# Patient Record
Sex: Female | Born: 1945 | Race: White | Hispanic: No | Smoking: Current every day smoker
Health system: Southern US, Community
[De-identification: ages and names within clinical notes are randomized; demographics above are authoritative.]

## PROBLEM LIST (undated history)

## (undated) DIAGNOSIS — F329 Major depressive disorder, single episode, unspecified: Secondary | ICD-10-CM

## (undated) DIAGNOSIS — F419 Anxiety disorder, unspecified: Secondary | ICD-10-CM

## (undated) DIAGNOSIS — J449 Chronic obstructive pulmonary disease, unspecified: Secondary | ICD-10-CM

## (undated) DIAGNOSIS — I10 Essential (primary) hypertension: Secondary | ICD-10-CM

## (undated) DIAGNOSIS — M1990 Unspecified osteoarthritis, unspecified site: Secondary | ICD-10-CM

## (undated) DIAGNOSIS — F32A Depression, unspecified: Secondary | ICD-10-CM

## (undated) DIAGNOSIS — E785 Hyperlipidemia, unspecified: Secondary | ICD-10-CM

## (undated) DIAGNOSIS — I639 Cerebral infarction, unspecified: Secondary | ICD-10-CM

## (undated) DIAGNOSIS — K8020 Calculus of gallbladder without cholecystitis without obstruction: Secondary | ICD-10-CM

## (undated) HISTORY — PX: CHOLECYSTECTOMY: SHX55

## (undated) HISTORY — DX: Calculus of gallbladder without cholecystitis without obstruction: K80.20

## (undated) HISTORY — PX: CATARACT EXTRACTION: SUR2

## (undated) HISTORY — DX: Hyperlipidemia, unspecified: E78.5

## (undated) HISTORY — DX: Cerebral infarction, unspecified: I63.9

## (undated) HISTORY — PX: ABDOMINAL HYSTERECTOMY: SHX81

## (undated) HISTORY — PX: TUBAL LIGATION: SHX77

## (undated) HISTORY — DX: Unspecified osteoarthritis, unspecified site: M19.90

## (undated) HISTORY — DX: Essential (primary) hypertension: I10

## (undated) HISTORY — PX: BLADDER SURGERY: SHX569

## (undated) HISTORY — PX: APPENDECTOMY: SHX54

## (undated) HISTORY — PX: MANDIBLE SURGERY: SHX707

---

## 1999-12-23 ENCOUNTER — Encounter: Admission: RE | Admit: 1999-12-23 | Discharge: 1999-12-23 | Payer: Self-pay | Admitting: Unknown Physician Specialty

## 2000-10-26 ENCOUNTER — Encounter: Admission: RE | Admit: 2000-10-26 | Discharge: 2001-01-06 | Payer: Self-pay | Admitting: Anesthesiology

## 2001-04-20 ENCOUNTER — Encounter: Admission: RE | Admit: 2001-04-20 | Discharge: 2001-07-19 | Payer: Self-pay

## 2001-07-23 ENCOUNTER — Encounter: Admission: RE | Admit: 2001-07-23 | Discharge: 2001-10-21 | Payer: Self-pay

## 2001-10-19 ENCOUNTER — Ambulatory Visit (HOSPITAL_COMMUNITY): Admission: RE | Admit: 2001-10-19 | Discharge: 2001-10-19 | Payer: Self-pay

## 2001-11-12 ENCOUNTER — Encounter: Admission: RE | Admit: 2001-11-12 | Discharge: 2002-02-05 | Payer: Self-pay | Admitting: Anesthesiology

## 2001-12-17 ENCOUNTER — Ambulatory Visit (HOSPITAL_COMMUNITY): Admission: RE | Admit: 2001-12-17 | Discharge: 2001-12-17 | Payer: Self-pay | Admitting: Anesthesiology

## 2001-12-17 ENCOUNTER — Encounter: Payer: Self-pay | Admitting: Anesthesiology

## 2002-02-18 ENCOUNTER — Encounter: Admission: RE | Admit: 2002-02-18 | Discharge: 2002-05-19 | Payer: Self-pay | Admitting: Anesthesiology

## 2002-05-17 ENCOUNTER — Encounter: Admission: RE | Admit: 2002-05-17 | Discharge: 2002-08-15 | Payer: Self-pay

## 2002-08-19 ENCOUNTER — Encounter: Admission: RE | Admit: 2002-08-19 | Discharge: 2002-11-17 | Payer: Self-pay

## 2002-12-25 ENCOUNTER — Encounter
Admission: RE | Admit: 2002-12-25 | Discharge: 2003-03-25 | Payer: Self-pay | Admitting: Physical Medicine & Rehabilitation

## 2003-03-28 ENCOUNTER — Encounter
Admission: RE | Admit: 2003-03-28 | Discharge: 2003-06-26 | Payer: Self-pay | Admitting: Physical Medicine & Rehabilitation

## 2003-08-04 ENCOUNTER — Encounter
Admission: RE | Admit: 2003-08-04 | Discharge: 2003-11-02 | Payer: Self-pay | Admitting: Physical Medicine & Rehabilitation

## 2003-12-16 ENCOUNTER — Encounter
Admission: RE | Admit: 2003-12-16 | Discharge: 2004-03-15 | Payer: Self-pay | Admitting: Physical Medicine & Rehabilitation

## 2004-03-25 ENCOUNTER — Encounter
Admission: RE | Admit: 2004-03-25 | Discharge: 2004-06-23 | Payer: Self-pay | Admitting: Physical Medicine & Rehabilitation

## 2004-03-26 ENCOUNTER — Ambulatory Visit: Payer: Self-pay | Admitting: Physical Medicine & Rehabilitation

## 2004-06-24 ENCOUNTER — Encounter
Admission: RE | Admit: 2004-06-24 | Discharge: 2004-09-22 | Payer: Self-pay | Admitting: Physical Medicine & Rehabilitation

## 2004-06-25 ENCOUNTER — Ambulatory Visit: Payer: Self-pay | Admitting: Physical Medicine & Rehabilitation

## 2004-08-26 ENCOUNTER — Ambulatory Visit: Payer: Self-pay | Admitting: Physical Medicine & Rehabilitation

## 2004-10-28 ENCOUNTER — Ambulatory Visit: Payer: Self-pay | Admitting: Physical Medicine & Rehabilitation

## 2004-10-28 ENCOUNTER — Encounter
Admission: RE | Admit: 2004-10-28 | Discharge: 2005-01-26 | Payer: Self-pay | Admitting: Physical Medicine & Rehabilitation

## 2005-01-26 ENCOUNTER — Encounter
Admission: RE | Admit: 2005-01-26 | Discharge: 2005-04-26 | Payer: Self-pay | Admitting: Physical Medicine & Rehabilitation

## 2005-02-24 ENCOUNTER — Ambulatory Visit: Payer: Self-pay | Admitting: Physical Medicine & Rehabilitation

## 2005-06-23 ENCOUNTER — Encounter
Admission: RE | Admit: 2005-06-23 | Discharge: 2005-09-21 | Payer: Self-pay | Admitting: Physical Medicine & Rehabilitation

## 2005-06-23 ENCOUNTER — Ambulatory Visit: Payer: Self-pay | Admitting: Physical Medicine & Rehabilitation

## 2005-09-20 ENCOUNTER — Encounter
Admission: RE | Admit: 2005-09-20 | Discharge: 2005-12-19 | Payer: Self-pay | Admitting: Physical Medicine & Rehabilitation

## 2005-09-22 ENCOUNTER — Ambulatory Visit: Payer: Self-pay | Admitting: Physical Medicine & Rehabilitation

## 2005-12-15 ENCOUNTER — Ambulatory Visit: Payer: Self-pay | Admitting: Physical Medicine & Rehabilitation

## 2006-03-10 ENCOUNTER — Encounter
Admission: RE | Admit: 2006-03-10 | Discharge: 2006-06-08 | Payer: Self-pay | Admitting: Physical Medicine & Rehabilitation

## 2006-03-10 ENCOUNTER — Ambulatory Visit: Payer: Self-pay | Admitting: Physical Medicine & Rehabilitation

## 2006-06-01 ENCOUNTER — Ambulatory Visit: Payer: Self-pay | Admitting: Physical Medicine & Rehabilitation

## 2006-06-01 ENCOUNTER — Encounter
Admission: RE | Admit: 2006-06-01 | Discharge: 2006-08-30 | Payer: Self-pay | Admitting: Physical Medicine & Rehabilitation

## 2006-08-25 ENCOUNTER — Ambulatory Visit: Payer: Self-pay | Admitting: Physical Medicine & Rehabilitation

## 2006-11-06 ENCOUNTER — Ambulatory Visit: Payer: Self-pay | Admitting: Physical Medicine & Rehabilitation

## 2006-11-06 ENCOUNTER — Encounter
Admission: RE | Admit: 2006-11-06 | Discharge: 2007-02-04 | Payer: Self-pay | Admitting: Physical Medicine & Rehabilitation

## 2006-12-19 ENCOUNTER — Ambulatory Visit: Payer: Self-pay | Admitting: Physical Medicine & Rehabilitation

## 2007-03-20 ENCOUNTER — Encounter
Admission: RE | Admit: 2007-03-20 | Discharge: 2007-04-24 | Payer: Self-pay | Admitting: Physical Medicine & Rehabilitation

## 2007-04-20 ENCOUNTER — Ambulatory Visit: Payer: Self-pay | Admitting: Physical Medicine & Rehabilitation

## 2007-07-17 ENCOUNTER — Encounter
Admission: RE | Admit: 2007-07-17 | Discharge: 2007-07-18 | Payer: Self-pay | Admitting: Physical Medicine & Rehabilitation

## 2007-07-17 ENCOUNTER — Ambulatory Visit: Payer: Self-pay | Admitting: Physical Medicine & Rehabilitation

## 2007-10-08 ENCOUNTER — Encounter
Admission: RE | Admit: 2007-10-08 | Discharge: 2007-10-08 | Payer: Self-pay | Admitting: Physical Medicine & Rehabilitation

## 2007-10-08 ENCOUNTER — Ambulatory Visit: Payer: Self-pay | Admitting: Physical Medicine & Rehabilitation

## 2008-01-04 ENCOUNTER — Encounter
Admission: RE | Admit: 2008-01-04 | Discharge: 2008-01-07 | Payer: Self-pay | Admitting: Physical Medicine & Rehabilitation

## 2008-01-07 ENCOUNTER — Ambulatory Visit: Payer: Self-pay | Admitting: Physical Medicine & Rehabilitation

## 2008-03-24 ENCOUNTER — Ambulatory Visit: Payer: Self-pay | Admitting: Physical Medicine & Rehabilitation

## 2008-03-24 ENCOUNTER — Encounter
Admission: RE | Admit: 2008-03-24 | Discharge: 2008-06-22 | Payer: Self-pay | Admitting: Physical Medicine & Rehabilitation

## 2008-05-15 ENCOUNTER — Ambulatory Visit: Payer: Self-pay | Admitting: Physical Medicine & Rehabilitation

## 2008-06-11 ENCOUNTER — Ambulatory Visit: Payer: Self-pay | Admitting: Physical Medicine & Rehabilitation

## 2008-07-24 ENCOUNTER — Encounter
Admission: RE | Admit: 2008-07-24 | Discharge: 2008-10-22 | Payer: Self-pay | Admitting: Physical Medicine and Rehabilitation

## 2008-07-28 ENCOUNTER — Ambulatory Visit: Payer: Self-pay | Admitting: Physical Medicine and Rehabilitation

## 2008-07-29 ENCOUNTER — Ambulatory Visit (HOSPITAL_COMMUNITY)
Admission: RE | Admit: 2008-07-29 | Discharge: 2008-07-29 | Payer: Self-pay | Admitting: Physical Medicine and Rehabilitation

## 2008-08-14 ENCOUNTER — Ambulatory Visit: Payer: Self-pay | Admitting: Physical Medicine and Rehabilitation

## 2008-08-27 ENCOUNTER — Ambulatory Visit: Payer: Self-pay | Admitting: Physical Medicine and Rehabilitation

## 2008-09-26 ENCOUNTER — Ambulatory Visit: Payer: Self-pay | Admitting: Physical Medicine and Rehabilitation

## 2008-10-28 ENCOUNTER — Encounter
Admission: RE | Admit: 2008-10-28 | Discharge: 2008-10-31 | Payer: Self-pay | Admitting: Physical Medicine and Rehabilitation

## 2008-10-31 ENCOUNTER — Ambulatory Visit: Payer: Self-pay | Admitting: Physical Medicine and Rehabilitation

## 2009-02-24 ENCOUNTER — Encounter
Admission: RE | Admit: 2009-02-24 | Discharge: 2009-04-30 | Payer: Self-pay | Admitting: Physical Medicine and Rehabilitation

## 2009-02-25 ENCOUNTER — Ambulatory Visit: Payer: Self-pay | Admitting: Physical Medicine and Rehabilitation

## 2009-05-18 ENCOUNTER — Encounter
Admission: RE | Admit: 2009-05-18 | Discharge: 2009-08-16 | Payer: Self-pay | Admitting: Physical Medicine and Rehabilitation

## 2009-05-29 ENCOUNTER — Ambulatory Visit: Payer: Self-pay | Admitting: Physical Medicine and Rehabilitation

## 2009-06-26 ENCOUNTER — Ambulatory Visit: Payer: Self-pay | Admitting: Physical Medicine and Rehabilitation

## 2009-08-03 ENCOUNTER — Ambulatory Visit: Payer: Self-pay | Admitting: Physical Medicine and Rehabilitation

## 2009-11-03 ENCOUNTER — Encounter
Admission: RE | Admit: 2009-11-03 | Discharge: 2010-02-01 | Payer: Self-pay | Admitting: Physical Medicine and Rehabilitation

## 2009-11-11 ENCOUNTER — Ambulatory Visit: Payer: Self-pay | Admitting: Physical Medicine and Rehabilitation

## 2009-12-14 ENCOUNTER — Ambulatory Visit: Payer: Self-pay | Admitting: Physical Medicine and Rehabilitation

## 2010-02-01 ENCOUNTER — Encounter
Admission: RE | Admit: 2010-02-01 | Discharge: 2010-05-02 | Payer: Self-pay | Source: Home / Self Care | Attending: Physical Medicine and Rehabilitation | Admitting: Physical Medicine and Rehabilitation

## 2010-02-08 ENCOUNTER — Ambulatory Visit: Payer: Self-pay | Admitting: Physical Medicine and Rehabilitation

## 2010-03-10 ENCOUNTER — Ambulatory Visit: Payer: Self-pay | Admitting: Physical Medicine and Rehabilitation

## 2010-04-07 ENCOUNTER — Ambulatory Visit: Payer: Self-pay | Admitting: Physical Medicine and Rehabilitation

## 2010-05-11 ENCOUNTER — Encounter
Admission: RE | Admit: 2010-05-11 | Discharge: 2010-05-14 | Payer: Self-pay | Source: Home / Self Care | Attending: Physical Medicine and Rehabilitation | Admitting: Physical Medicine and Rehabilitation

## 2010-05-14 ENCOUNTER — Ambulatory Visit
Admission: RE | Admit: 2010-05-14 | Discharge: 2010-05-14 | Payer: Self-pay | Source: Home / Self Care | Attending: Physical Medicine and Rehabilitation | Admitting: Physical Medicine and Rehabilitation

## 2010-06-23 ENCOUNTER — Encounter: Payer: MEDICARE | Attending: Physical Medicine and Rehabilitation

## 2010-06-23 ENCOUNTER — Ambulatory Visit (HOSPITAL_BASED_OUTPATIENT_CLINIC_OR_DEPARTMENT_OTHER): Payer: MEDICARE | Admitting: Physical Medicine and Rehabilitation

## 2010-06-23 DIAGNOSIS — IMO0001 Reserved for inherently not codable concepts without codable children: Secondary | ICD-10-CM

## 2010-06-23 DIAGNOSIS — R279 Unspecified lack of coordination: Secondary | ICD-10-CM | POA: Insufficient documentation

## 2010-06-23 DIAGNOSIS — G894 Chronic pain syndrome: Secondary | ICD-10-CM

## 2010-06-23 DIAGNOSIS — M47814 Spondylosis without myelopathy or radiculopathy, thoracic region: Secondary | ICD-10-CM | POA: Insufficient documentation

## 2010-06-23 DIAGNOSIS — R209 Unspecified disturbances of skin sensation: Secondary | ICD-10-CM | POA: Insufficient documentation

## 2010-06-23 DIAGNOSIS — M549 Dorsalgia, unspecified: Secondary | ICD-10-CM

## 2010-06-23 DIAGNOSIS — M25519 Pain in unspecified shoulder: Secondary | ICD-10-CM | POA: Insufficient documentation

## 2010-06-23 DIAGNOSIS — M47812 Spondylosis without myelopathy or radiculopathy, cervical region: Secondary | ICD-10-CM | POA: Insufficient documentation

## 2010-06-23 DIAGNOSIS — M542 Cervicalgia: Secondary | ICD-10-CM

## 2010-06-23 DIAGNOSIS — M4120 Other idiopathic scoliosis, site unspecified: Secondary | ICD-10-CM | POA: Insufficient documentation

## 2010-06-23 DIAGNOSIS — G609 Hereditary and idiopathic neuropathy, unspecified: Secondary | ICD-10-CM | POA: Insufficient documentation

## 2010-07-20 ENCOUNTER — Encounter: Payer: MEDICARE | Attending: Physical Medicine and Rehabilitation

## 2010-07-20 ENCOUNTER — Ambulatory Visit (HOSPITAL_BASED_OUTPATIENT_CLINIC_OR_DEPARTMENT_OTHER): Payer: MEDICARE

## 2010-07-20 DIAGNOSIS — M47814 Spondylosis without myelopathy or radiculopathy, thoracic region: Secondary | ICD-10-CM

## 2010-07-20 DIAGNOSIS — Z79899 Other long term (current) drug therapy: Secondary | ICD-10-CM | POA: Insufficient documentation

## 2010-07-20 DIAGNOSIS — M4120 Other idiopathic scoliosis, site unspecified: Secondary | ICD-10-CM | POA: Insufficient documentation

## 2010-07-20 DIAGNOSIS — M25519 Pain in unspecified shoulder: Secondary | ICD-10-CM | POA: Insufficient documentation

## 2010-07-20 DIAGNOSIS — E7800 Pure hypercholesterolemia, unspecified: Secondary | ICD-10-CM | POA: Insufficient documentation

## 2010-07-20 DIAGNOSIS — F17200 Nicotine dependence, unspecified, uncomplicated: Secondary | ICD-10-CM | POA: Insufficient documentation

## 2010-07-20 DIAGNOSIS — I10 Essential (primary) hypertension: Secondary | ICD-10-CM | POA: Insufficient documentation

## 2010-07-20 DIAGNOSIS — R209 Unspecified disturbances of skin sensation: Secondary | ICD-10-CM | POA: Insufficient documentation

## 2010-07-20 DIAGNOSIS — E119 Type 2 diabetes mellitus without complications: Secondary | ICD-10-CM | POA: Insufficient documentation

## 2010-07-20 DIAGNOSIS — G8929 Other chronic pain: Secondary | ICD-10-CM | POA: Insufficient documentation

## 2010-07-20 DIAGNOSIS — M47812 Spondylosis without myelopathy or radiculopathy, cervical region: Secondary | ICD-10-CM | POA: Insufficient documentation

## 2010-07-20 DIAGNOSIS — R279 Unspecified lack of coordination: Secondary | ICD-10-CM

## 2010-07-20 DIAGNOSIS — G609 Hereditary and idiopathic neuropathy, unspecified: Secondary | ICD-10-CM | POA: Insufficient documentation

## 2010-07-20 DIAGNOSIS — M5137 Other intervertebral disc degeneration, lumbosacral region: Secondary | ICD-10-CM

## 2010-08-25 ENCOUNTER — Encounter
Payer: Medicare Other | Attending: Physical Medicine and Rehabilitation | Admitting: Physical Medicine and Rehabilitation

## 2010-08-25 DIAGNOSIS — M47812 Spondylosis without myelopathy or radiculopathy, cervical region: Secondary | ICD-10-CM | POA: Insufficient documentation

## 2010-08-25 DIAGNOSIS — G894 Chronic pain syndrome: Secondary | ICD-10-CM

## 2010-08-25 DIAGNOSIS — E7800 Pure hypercholesterolemia, unspecified: Secondary | ICD-10-CM | POA: Insufficient documentation

## 2010-08-25 DIAGNOSIS — E119 Type 2 diabetes mellitus without complications: Secondary | ICD-10-CM | POA: Insufficient documentation

## 2010-08-25 DIAGNOSIS — R209 Unspecified disturbances of skin sensation: Secondary | ICD-10-CM | POA: Insufficient documentation

## 2010-08-25 DIAGNOSIS — R279 Unspecified lack of coordination: Secondary | ICD-10-CM | POA: Insufficient documentation

## 2010-08-25 DIAGNOSIS — M4120 Other idiopathic scoliosis, site unspecified: Secondary | ICD-10-CM | POA: Insufficient documentation

## 2010-08-25 DIAGNOSIS — M47814 Spondylosis without myelopathy or radiculopathy, thoracic region: Secondary | ICD-10-CM | POA: Insufficient documentation

## 2010-08-25 DIAGNOSIS — M25519 Pain in unspecified shoulder: Secondary | ICD-10-CM | POA: Insufficient documentation

## 2010-08-25 DIAGNOSIS — I10 Essential (primary) hypertension: Secondary | ICD-10-CM | POA: Insufficient documentation

## 2010-08-25 DIAGNOSIS — Z79899 Other long term (current) drug therapy: Secondary | ICD-10-CM | POA: Insufficient documentation

## 2010-08-25 DIAGNOSIS — M542 Cervicalgia: Secondary | ICD-10-CM

## 2010-08-25 DIAGNOSIS — Z794 Long term (current) use of insulin: Secondary | ICD-10-CM | POA: Insufficient documentation

## 2010-08-26 NOTE — Assessment & Plan Note (Signed)
[name] is a pleasant 65 year old woman who is followed in our [facility] for chronic pain complaints, predominantly in her right scapular region.  She also has a medical history which is significant for diabetes and peripheral neuropathy as well as small vessel ischemic changes per MRI in 2008, and a mild balance disorder.  She is back in today.  She was last seen on June 23, 2010.  At that visit, she underwent trigger point injections and she tolerated them well.  She reports very little improvement in her pain after the injections.  She states that they cost more than she would have liked as well.  She is interested in considering increasing her gabapentin to see if she can get a bit more relief from the periscapular pain.  Her average pain is about an 8 on a scale of 10.  Sleep is fair to poor. Pain is worse with most activities, improves with medication somewhat.  FUNCTIONAL STATUS:  She can walk 10 minutes at a time.  She is able to drive.  She is independent with self-care.  She is able to do some small tasks at home as well.  She does not do heavier household tasks.  REVIEW OF SYSTEMS:  Negative for any problems controlling bowel or bladder, depression, anxiety, or suicidal ideation.  She denies any new weakness, numbness, tingling, or tremors.  She does report a little gait instability, but this is not new.  [facility]ications [facility] include: 1. Gabapentin 400 mg 5 times a day. 2. P.r.n. Soma not more than once per day. 3. Lidoderm patch p.r.n. 4. Hydrocodone 5/325 b.i.d.  No other changes in past medical, social, or family history.  PHYSICAL EXAMINATION:  VITAL SIGNS:  Today, her blood pressure is 158/73, pulse 75, respirations 18, 98% saturated on room air. GENERAL:  She is a well-developed, well-nourished woman who does not appear in any distress.  She appears her stated age. NEUROLOGIC:  She is  oriented x3.  Speech is clear.  Affect is bright. She is alert, cooperative, and pleasant, follows commands without difficulty, answers my questions appropriately.  Cranial nerves, coordination are grossly intact.  Her reflexes are slightly brisk which is not new.  Her strength is good without focal deficits.  No new sensory deficits are noted.  She has some tenderness along the paraspinal musculature adjacent to her scapula, especially on the right.  She has full shoulder range of motion.  Neck range of motion is minimally limited as well.  Transitioning from standing to sitting is done without difficulty.  Gait is nonantalgic.  Tandem gait and Romberg test are also performed adequately.  IMPRESSION: 1. History of cervical spondylosis. 2. Mild thoracic scoliosis/spondylosis. 3. Myofascial component of parascapular pain. 4. History of bilateral foot numbness which is consistent with     diabetic and peripheral neuropathy. 5. Mild balance disorder which is not progressive.  Her medical problems include diabetes on insulin, hypercholesterolemia, hypertension, nicotine addiction, small vessel ischemic changes per a brain MRI back in 2008.  PLAN:  Katherine Bullock is requesting a slight increase in her gabapentin.  I have reviewed the risks and benefits of using gabapentin and the side effect profile.  She would like to try to increase this to see if she can get a little more relief.  We will switch her to 600 mg gabapentin at 8:30 a.m., 12:30 p.m., 4:30 p.m., 8:30 p.m., and 12:30 p.m. with 2 refills.  I  will also refill her Soma 350 mg 1 p.o. nightly, #30 p.r.n. muscle spasm.  She is requesting to be discontinued off the hydrocodone at this time.  She states that she would like to not have to return to the clinic each month for pill counts and thinks she can get by without it.  She maintains contact with her primary care doctor, Dr. Abner Greenspan. She has an appointment with her in the  upcoming couple of months.  Katherine Bullock has been doing well on the medications without any adverse side effects.  She appears to be satisfied with current management.  I will see her back in 3 months.  She will let me know if she has any trouble with a slight increase in her gabapentin.     Brantley Stage, M.D. Electronically Signed    DMK/MedQ D:  08/26/2010 08:14:16  T:  08/26/2010 10:09:41  Job #:  [id number]

## 2010-09-21 NOTE — Assessment & Plan Note (Signed)
Katherine Bullock returns to clinic today for followup evaluation. When we saw  her in July of 2008 we decided to discontinue the methadone and try  oxycodone 5 mg 1 to 2 tablets p.o. t.i.d. p.r.n. She subsequently called  back a couple days later saying that she was getting no relief from the  oxycodone immediate release. She requested to be restarted on the  methadone. We did give her a script for the methadone when she returned  the used oxycodone. She actually had some left over methadone from prior  scripts and did not fill that script dated November 29, 2006. She returns  the script today in the office today so that we can destroy it and give  her an up dated prescription for the methadone. She reports that on the  methadone used 3 times a day she is getting reasonable relief.   The patient has had some vague problems with not feeling well. She felt  that it may be secondary to some of her medicines and she stopped all of  her blood pressure and diabetes medicines, along with her with her  cholesterol and depression medications. She is considering restarting  each one of them gradually to see if she can tolerate them. She has been  on verapamil, along with Lasix, clonidine, Paxil, lisinopril, along with  diabetes medicines. All of those have been stopped although she  continues to take the aspirin. She was also on Plavix daily.   MEDICATIONS:  1. Methadone 10 mg 1 tablet t.i.d.  2. Aspirin 1 tablet daily.   REVIEW OF SYSTEMS:  Positive for night sweats, high blood sugar, and  limb swelling.   PHYSICAL EXAMINATION:  Reasonably well-appearing elderly adult female in  mild acute discomfort. Blood pressure 160/72, with a pulse of 68,  respiratory rate 18, and O2 saturation 98% on room air. She has minimal  swelling throughout the bilateral lower extremities. She has 4 + over 5  strength throughout.   IMPRESSION:  1. 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  2. Myofascial pain syndrome.   In the office today we did refill the patient's methadone dated as  December 20, 2006. I told her that she can probably restart her lisinopril  and possibly her verapamil without significant side effects. The  clonidine may be centrally acting and causing some of her vague feelings  of feeling ill. In any event she needs to gradually restart the blood  pressure medicines at least for now and consider starting the diabetes  medicines depending on her blood sugars at home. She reports that her  recent blood sugar was 130.   We will plan on seeing the patient in follow up in approximately 3  months time. She does have plans to follow up with her primary care  physician over the next week or so.           ______________________________  Ellwood Dense, M.D.     DC/MedQ  D:  12/20/2006 09:17:43  T:  12/21/2006 16:10:96  Job #:  [id number]

## 2010-09-21 NOTE — Assessment & Plan Note (Signed)
Ms. Ey returns to the clinic today for followup evaluation.  Her last  prescription for methadone was written on December 05, 2007.  She did  stretch out her supply to make it into today's appointment, but  generally takes 4 per day when she has sufficient supply.  She prefers  to do that way and avoid 2 trips, one for the medication and then one  for the followup.  The patient reports that her hemoglobin A1c recently  was measured at greater than 7.0.  She feels that her primary care  physician will probably start her on insulin in the near future.  Her  other medicines have been unchanged.   REVIEW OF SYSTEMS:  Positive for high blood sugar and night sweats along  with limb swelling.   MEDICATIONS:  1. Methadone 10 mg 1 tablet q.i.d.  2. Aspirin 81 mg daily.  3. Lovaza 1 g q.i.d.  4. Furosemide 20 mg 1 to 2 tablets p.o. daily p.r.n.  5. Metformin 1000 mg b.i.d.  6. Verapamil 240 mg daily.  7. Lisinopril 40 mg 2 tablets daily.  8. Clonidine 0.1 mg b.i.d.  9. Prozac 40 mg daily.  10.Pantoprazole 40 mg b.i.d.  11.Glimepiride 4 mg daily.  12.TriCor 145 mg daily.   PHYSICAL EXAMINATION:  GENERAL:  Well-appearing fit adult female in mild-  to-no acute discomfort.  VITAL SIGNS:  Blood pressure 149/62 with pulse of 80, respiratory rate  18, and O2 saturation 98% on room air.  EXTREMITIES:  She has 4+/5 strength throughout.  She ambulates with a  single-point cane.   IMPRESSION:  1. Chronic thoracic pain and thoracic scoliosis with myelopathy.  2. Mild degenerative disk disease of the thoracic spine.  3. Myofascial pain syndrome.   In the office today, we did refill the patient's methadone at 10 mg 1  tablet q.i.d. p.r.n. a total of 120 to cover up for the month.  We will  plan on seeing the patient in followup in this office in approximately 3-  4 months' times with refills prior to that appointment as necessary.  The patient continues to get good analgesic effect without signs  of  diversion or significant side effects.           ______________________________  Ellwood Dense, M.D.     DC/MedQ  D:  01/07/2008 12:23:13  T:  01/08/2008 02:46:31  Job #:  [id number]

## 2010-09-21 NOTE — Assessment & Plan Note (Signed)
Ms. Lacerda returns to clinic today for followup evaluation.  During the  last clinic visit April 23, 2007 we had adjusted her methadone to 4  times a day instead of 3 times a day at her 10 mg dose.  She reports  that she is getting much better relief.  She previously had passing out  spells that she felt were related to Lipitor.  Since she is off that  medication now on Tricor, she is having no spells and is tolerating the  methadone well.  She does need a refill over the next few weeks.  She  continues to be followed by Dr. Yetta Flock, her primary care physician, with  recent CBGs having been recorded as within normal limits.  She has a  followup visit with Dr. Yetta Flock next week.  The patient lives a fair  distance from the office, approximately 72 miles roundtrip.   REVIEW OF SYSTEMS:  Positive for high blood sugar, night sweats, limb  swelling.   MEDICATIONS:  1. Methadone 10 mg 1 tablet q.i.d.  2. Aspirin 81 mg daily.  3. Lovaza 1 gram q.i.d.  4. Furosemide 20 mg 1-2 tablets p.o. daily p.r.n.  5. Metformin 1000 mg b.i.d.  6. Verapamil 240 mg daily.  7. Lisinopril 40 mg 2 tablets daily.  8. Clonidine 0.1 mg b.i.d.  9. Prozac 40 mg daily.  10.Pantoprazole 40 mg b.i.d.  11.Glimepiride 4 mg daily.  12.Tricor 145 mg daily.   PHYSICAL EXAMINATION:  Well appearing, thin adult female in mild to no  acute discomfort.  Blood pressure 118/60 with a pulse of 94, respiratory  rate 18 and O2 saturation 98% on room air.  She has 4+/5 strength  throughout the bilateral upper and lower extremities.   IMPRESSION:  1. 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  2. Myofascial pain syndrome.   In the office today we did refill the patient's methadone at a 10 mg  strength 4 times a day as of August 11, 2007.  She will hold on to the  prescription and submit it to her pharmacy in early April.  We will plan  on seeing  the patient in followup in approximately three months' time  with refills prior to that appointment as necessary.           ______________________________  Ellwood Dense, M.D.     DC/MedQ  D:  07/18/2007 09:51:57  T:  07/18/2007 13:17:13  Job #:  [id number]

## 2010-09-21 NOTE — Assessment & Plan Note (Signed)
[name] is a pleasant 65 year old married woman, who has been a  prior patient of Dr. Ellwood Bullock.  She is back in today for a refill  of her Neurontin.  She was recently started on this in April.  She has  been on methadone and this was weaned down and she has been tolerating  the Neurontin quite well.  She was on 600 mg tablet 4 times a month and  reports significant pain relief with medication.  In fact, she notes  that if she misses doses that, her pain does worsen.  Average pain is  about 8 on the scale of 10.  Reports fairly good relief with current  meds.  Pain is typically localized to the mid back region.   Pain is typically worse with walking and prolonged sitting or standing,  improves with medication.   FUNCTIONAL STATUS:  She can walk about 10-15 minutes at a time.  She has  difficulty with stairs.  She does drive.  She is independent with self-  care.   Review of systems is notable for trouble walking, depression, and denies  anxiety or suicidal ideation.  She reports, she recently is going to be  started on some insulin.  Apparently, her diabetes is requiring that,  now her primary care Dr. Abner Bullock is managing this for her.   No other changes in past medical, social, or family history.  She  continues to smoke a pack of cigarettes a day.  She is cautioned against  this.   Exam, blood pressure 116/54, pulse 85, respirations 18, 98% saturated on  room air.  She is a thin adult female, who does not appear in any  distress and does appear her stated age.   She is oriented x3.  Speech is clear.  Affect is bright.  She is alert,  cooperative, and pleasant.  Follows commands without difficulty.  Answers my questions appropriately.   Cranial nerves and coordination are grossly intact.  Sensation is  intact.  She has limitations in range of motion in neck in all planes.  She has full shoulder range of motion.  Motor strength is 5/5 without  focal deficits.  No  sensory deficits are appreciated.  Reflexes are  generally 2+.  No abnormal tone or clonus is appreciated in the upper  and lower extremities.   IMPRESSION:  1. Chronic right scapular pain.  2. Mild thoracic scoliosis.  3. History of small vessel ischemia per CT in March 2008.   PLAN:  We will refill her gabapentin 600 mg 1 p.o. q.i.d. #120.  She has  been tolerating the medication quite well.  Denies any problems with  lower extremity swelling.  Denies problems with dizziness or balance and  finds that the medication is significantly helping her pain.  We will go  ahead and refill this with 3 refills.  We will see her back in 4 months.           ______________________________  Katherine Bullock, M.D.     DMK/MedQ  D:  10/31/2008 11:48:05  T:  11/01/2008 01:08:10  Job #:  [id number]

## 2010-09-21 NOTE — Assessment & Plan Note (Signed)
Katherine Bullock is a pleasant 65 year old married woman who had been a prior  patient of Dr. Ellwood Dense.  She was initially seen by me on July 28, 2008.  Her last visit was August 14, 2008.   Her pain has been treated in the past by 10 mg of methadone q.i.d., this  was wean down, her last dose of methadone was on August 11, 2008.  In the  interim, she has been started on Neurontin 300 mg q.i.d. which she has  tolerated well.  She is complaining of pain in her right scapular area  and bilateral lower extremities today.   Sleep is tends to be poor.  Pain is worse with activities; improves with  rest.  She does not feel the Neurontin is helping that much at this  point, although she is on the low dose.   Her functional status is as follows, she is able to walk about 10  minutes at a time.  She does not climb stairs.  She is able to drive.  She helps out at home with household tasks such as laundry.  She does  some cooking and helps out with some cleaning of the dishes.  She is  independent with self care.  She denies problems controlling bowel or  bladder.  She denies suicidal ideation.  Admits to some depression.   REVIEW OF SYSTEMS:  Negative, has variations in blood sugar, and  occasional limb swelling.  Family doctor is Katherine Bullock with whom she  maintains contact.   No other changes in past medical, social, or family history since last  visit.   [facility] records were reviewed today from a  hospitalization in March 2008, brain MRI at that time showed T2  hyperintensities in the white matter changes which were felt to be  consistent with small vessel ischemia.  Brain CT in March 2008 as well  showed bilateral frontal lobe subcortical white matter hyperintensities,  which were felt to be nonspecific finding.  Hospitalization in March  2008, indicated she had a low sodium 119, felt secondary to  hydrochlorothiazide, orthostatic blood pressures were noted.  No  arrhythmias were appreciated during monitoring, elevated LFTs were felt  secondary to hyperlipidemic medications.   Exam today, blood pressure is 134/66, pulse 83, respirations 18, and 98%  saturation on room air.  She is a thin adult female who does not appear  in any distress.  She is oriented x3.  Speech is clear.  Affect is  bright.  She is alert, cooperative, and pleasant.  Follows commands  without difficulty and answers questions appropriately.   Cranial nerves and coordination are grossly intact.  Reflexes overall  brisk.  Sensory exam unchanged from previous exam.  Motor strength 5/5  without focal deficits.  She does have a significant trochanteric  tenderness bilaterally with palpation today and this goes down the  iliotibial band as well.  She has relatively well-preserved range of  motion in her shoulders, minimal range of motion deficits noted with  respect to cervical range of motion, very good forward flexion,  extension of the lumbar spine is appreciated as well.   Her balance is in good range.  She is able to independently tandem walk  for me today.  Romberg test is negative.   She has some areas of tenderness especially along in the thoracic  paraspinal musculature and in the low lumbar paraspinal muscles.   IMPRESSION:  1. Chronic right scapular pain most likely  secondary to nerve root      irritation.  The patient has a history of cervical spondylosis C3-4      through C6-7 to mild degenerative disk disease and thoracic spine.  2. Mild thoracic scoliosis.  3. Increased tone throughout upper and lower extremities, see above      brain scan reports from 2008.   MEDICAL PROBLEMS:  History of GERD, hypertension, diabetes, history of  hypercholesterolemia, 40 pack-year smoking history, and significant  caffeine intake.   PLAN:  We will titrate Neurontin up slightly higher today, she is on 300  mg 4 times day.  We will give her a new prescription for 400 mg 4  times  a day, and after 3 days, may add second dose in the evening 400 mg  t.i.d. plus 2 tablets nightly.   Again, risk and benefits of this medication are reviewed with her.  If  she has any problems oversedation, balance problems, trouble walking,  dizziness, she will decrease her dose.  She will call our clinic and let  us know she has had some trouble with the medication.  We will see her  back in a month, considered trochanteric bursitis injection at the next  visit, she would like to think about this.  I counseled her regarding  smoking today again as well.            ______________________________  Brantley Stage, M.D.     DMK/MedQ  D:  08/27/2008 09:24:33  T:  08/27/2008 22:04:55  Job #:  [id number]   cc:   Katherine Greenspan, MD

## 2010-09-21 NOTE — Assessment & Plan Note (Signed)
Katherine Bullock returns to clinic today for followup evaluation.  She reports  that she is getting good relief using her methadone for back pain.  She  has experienced more trouble in her left upper arm for the past 2  months.  She reports intermittent throbbing pain which is helped with  heat or muscle rub, but not helped much with the methadone.  She reports  no trauma, but feels that she may have pulled a muscle, although there  is no particular event that she remembers as the cause.  She reports  that her blood sugars had been elevated and she has had poor cholesterol  readings recently with her doctor.  She plans to see her primary care  physician and she reports that they will probably be adjusting her  medications.   The patient does need a refill on her methadone over the next month or  so.   REVIEW OF SYSTEMS:  Noncontributory.   MEDICATIONS:  1. Methadone 10 mg 1 tablet q.i.d.  2. Aspirin 81 mg daily.  3. Lovaza 1 g q.i.d.  4. Furosemide 20 mg 1-2 tablets p.o. daily p.r.n.  5. Metformin 1000 mg b.i.d.  6. Verapamil 240 mg daily.  7. Lisinopril 40 mg 2 tablets daily.  8. Clonidine 0.1 mg b.i.d.  9. Prozac 40 mg daily.  10.Pantoprazole 40 mg b.i.d.  11.Glimepiride 4 mg daily.  12.Tricor 145 mg daily.   PHYSICAL EXAMINATION:  Well-appearing adult female in mild acute  discomfort involving her left proximal shoulder/upper arm.  Blood  pressure is 160/71 with the pulse 72, respiratory rate 18, and O2  saturation 97% on room air.  She has 4+/5 strength throughout the right  upper and bilateral lower extremities, and 4/5 strength in the left  upper extremity.  She ambulates with a single point cane.   IMPRESSION:  1. Chronic thoracic pain and thoracic scoliosis with myelopathy.  2. Mild degenerative disk disease of the thoracic spine  3. Myofascial pain syndrome, mostly affecting left upper extremity at      present.   In the office today, we did refill the patient's methadone  as of May 30, 2008.  We will set her up for a followup visit in 1 month's time  either with myself or with the nursing staff.           ______________________________  Ellwood Dense, M.D.     DC/MedQ  D:  05/15/2008 09:31:12  T:  05/15/2008 22:41:03  Job #:  [id number]

## 2010-09-21 NOTE — Assessment & Plan Note (Signed)
Katherine Bullock returns to the clinic today for followup evaluation.  She  reports that [employer], her insurance company has informed her that they  will limit her methadone quantity as of May 09, 2008.  She is not  sure exactly what quantity that means, but she is getting good relief  for her methadone used 4 times per day at present.  She also reports  that [facility] letter has indicates they are stopping payment for numerous  other medicines that she is on.  They are also increasing her premium  and increasing her co-pays.   The patient reports that her blood sugar have been in the 200 range with  an recent hemoglobin A1c of approximately 7.0.  She continues to adjust  her intake to try to get better control.   REVIEW OF SYSTEMS:  Positive for night sweats, high blood sugar, and  limb swelling.   MEDICATIONS:  1. Methadone 10 mg 1 tablet q.i.d.  2. Aspirin 81 mg daily.  3. Lovaza 1 g q.i.d.  4. Furosemide 20 mg 1-2 tablets p.o. daily p.r.n.  5. Metformin 1000 mg b.i.d.  6. Verapamil 240 mg daily.  7. Lisinopril 40 mg 2 tablets daily.  8. Clonidine 0.1 mg b.i.d.  9. Prozac 40 mg daily.  10.Pantoprazole 40 mg b.i.d.  11.Glimepiride 4 mg daily.  12.Tricor 145 mg daily.   PHYSICAL EXAMINATION:  GENERAL:  Well-appearing thin adult female in  mild acute discomfort.  VITAL SIGNS:  Blood pressure 166/61 with pulse of 76, respirations 18,  and O2 saturation 99% on room air.  EXTREMITIES:  She has 4+/5 strength throughout.  She ambulates with a  single-point cane.   IMPRESSION:  1. Chronic thoracic pain and thoracic scoliosis with myelopathy.  2. Mild degenerative disk disease of the thoracic spine.  3. Myofascial pain syndrome.   In the office today, we did refill the patient's methadone 4 times per  day, a total of 120.  We will plan on refilling yet in approximately  April 21, 2008, and then we will see her in followup in early January  2010 to discuss what the pain medicines we  need to switch to if the  quantity limitation is too severe.  We will plan on seeing her in early  January 2010.           ______________________________  Ellwood Dense, M.D.     DC/MedQ  D:  03/24/2008 11:35:19  T:  03/25/2008 04:25:12  Job #:  [id number]

## 2010-09-21 NOTE — Assessment & Plan Note (Signed)
Katherine Bullock returns to clinic today for followup evaluation.  She last had  her methadone filled August 28, 2007.  She did not call for a refill in  mid May, as she knew she had an appointment today.  She stretched out  her methadone to last until today, but used the last tablet this  morning.   The patient reports that her other medicines have remained the same.  She reports that her blood sugars at home are in the 80-150 range with  hemoglobin A1c to be obtained by her primary care physician during their  the next visit.  The patient continues to do chores around the home and  her husband helps.  She also has a pool that she gets into on a periodic  basis, but mostly for just lounging.  She also tries to stay as active  as possible around her home.   REVIEW OF SYSTEMS:  Positive for high blood sugar.   MEDICATIONS:  1. Methadone 10 mg 1 tablet q.i.d.  2. Aspirin 81 mg daily.  3. Lovaza 1 g q.i.d.  4. Furosemide 20 mg 1-2 tablets p.o. daily p.r.n.  5. Metformin 1000 mg b.i.d.  6. Verapamil 240 mg daily.  7. Lisinopril 40 mg 2 tablets daily.  8. Clonidine 0.1 mg b.i.d.  9. Prozac 40 mg daily.  10.Pantoprazole 40 mg b.i.d.  11.Glimepiride 4 mg daily.  12.TriCor 145 mg daily.   PHYSICAL EXAMINATION:  A well-appearing fit adult female in mild-to-no  acute discomfort.  Blood pressure 159/52 with a pulse of 68, respiratory rate 18, and O2  saturation 97% on room air.  She ambulates with a single-point cane.  She has 4+/5 strength throughout bilateral upper and lower extremities.   IMPRESSION:  1. 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  2. 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  3. Myofascial pain syndrome.   In the office today, we did refill the patient's methadone as of today,  October 08, 2007.  We will plan on seeing her in followup in approximately 3  month's time with refills prior to that appointment as necessary.     ______________________________  Ellwood Dense, M.D.     DC/MedQ  D:  10/08/2007 09:43:59  T:  10/09/2007 03:15:27  Job #:  [id number]

## 2010-09-21 NOTE — Assessment & Plan Note (Signed)
[name] is a pleasant 65 year old married woman who had been a  patient of Dr. Lamar Benes, was seen initially by me on July 28, 2008.  Katherine Bullock has a history of chronic right scapular pain and pain just  below the right scapula.  She has known thoracic spondylosis and a mild  thoracic scoliosis.  She is back in today for a brief recheck and refill  of her medications.  She had been maintained by Dr. Thomasena Edis, on  methadone 10 mg 4 times a day.  This was gradually reduced to 5 mg  t.i.d. and she discontinued her methadone earlier this week.  She did  not have any problems with vomiting.  She did note some diminishment in  her appetite, however.  Her average pain is about an 8 on a scale of 10,  which is about the same as when she was on her methadone.  She is  currently not on any pain medicines at this time.  Pain continues to be  located just below the right scapula, it is fairly constant, sharp, and  burning in nature.  She also states that in the interim she has  undergone some stress testing earlier this week and the results of this  are not yet known to her.   Sleep tends to be poor.  Pain is worse with walking, sitting, and  standing, improves with medication in the past.   FUNCTIONAL STATUS AS FOLLOWS:  She can walk about 10 minutes at a time.  She is able to drive.  She does not climb stairs.  She is independent  with self care.   REVIEW OF SYSTEMS:  Positive for depression and limb swelling.   PRIMARY CARE PHYSICIAN:  Abner Greenspan, MD   No changes otherwise in past medical, social, or family history.   Exam, blood pressure is 149/73, pulse 86, respirations 18, and 98%  saturated on room air.  She is well-developed, thin, adult female who does not appear in any  distress.  She is oriented x3.  Speech is clear.  Affect is bright.  She  is alert, cooperative, and pleasant.  Follows commands without  difficulty.  Answers questions appropriately.   Coordination is  grossly intact.  Sensation is notable for decreased  vibratory sense in the right lower extremity, decreased pinprick over  both upper and lower extremities.  Manual muscle testing, however,  reveals 5/5 strength in both upper and lower extremities.   Mildly reduced cervical range of motion is noted as well as lumbar  motion.  She has a normal gait.  She has a very minimal difficulty with  tandem gait and Romberg test.  Overall, perform these activities quite  well.   With respect to reflexes, she has 3+ reflexes about bilateral upper and  lower extremities, couple of beats of clonus bilaterally.  Overall  increased tone in upper as well as lower extremities are noted.   IMPRESSION:  1. Chronic right scapular pain.  2. Mild degenerative disk disease in thoracic spine.  3. Mild thoracic scoliosis.  4. Increased tone throughout upper and lower extremities.   PLAN:  The patient states she had been hospitalized in the past and had  an MRI or CT of her head.  She does not know the results, I would like  to review these records.  We will ask her to sign consent so that we can  view them.  I reviewed recently ordered cervical spine, thoracic spine  films as well as cervical MRI.  Cervical spine films showed limited  range of motion, but no pathologic motion noted.  Thoracic spine films  showed no acute findings except for mild degenerative disk disease and  mild scoliosis with the apex at T9-10.  MRI of the cervical spine showed  cervical spondylosis C3-4 through C6-7 with narrowing of the ventral  subarachnoid space, but no cord compression, sclerotic changes noted at  C6, vertebral body hemangioma at T1 vertebral body, which were also  noted in 2003.  This was reviewed with her as well today.   Her last methadone tablet was on August 11, 2008.  Today is August 14, 2008,  she has not taken methadone for several days now.  Would like to  discontinue it at this point, we will trial her on  Neurontin 300 mg one  p.o. nightly titrating up to 4 times a day.  We will obtain previous  records from [facility].           ______________________________  Brantley Stage, M.D.     DMK/MedQ  D:  08/14/2008 13:58:36  T:  08/15/2008 01:09:06  Job #:  [id number]   cc:   Abner Greenspan, MD

## 2010-09-21 NOTE — Assessment & Plan Note (Signed)
[name] is a pleasant 65 year old married woman who has been a  prior patient of Dr. Ellwood Bullock.  She was last seen by me on August 27, 2008.  She is [facility] primarily  for parascapular pain.  She describes the pain is rather constant,  burning, stabbing, aching in nature.  Average pain is about 8 on a scale  of 10.  Sleep is poor.  Pain is worse with walking, standing, bending,  sitting, has not improved much with medication recently.   She had been on Neurontin 400 mg 3 times a day and two 400 mg tablets at  night.   FUNCTIONAL STATUS:  She is to use a cane for ambulation due to  imbalance.  She has difficulty with stairs.  She is able to drive.  She  is independent with self-care, needs assistance with higher level  household tasks.   She admits to problems with walking and depression.  Denies suicidal  ideation, problems with bowel or bladder, no new numbness, tingling,  weakness or tremors.   She has variations in blood sugars, diabetic.  She has occasional limb  swelling.  Primary care doctor is Dr. Yetta Bullock.   Past medical, social, family history are unchanged.  She smokes a pack  of cigarettes a day.  She has an eighth grade education.   Medications provided through this clinic include Neurontin 400 mg 1 p.o.  t.i.d., 2 tablets p.o. nightly.   PHYSICAL EXAMINATION:  Her blood pressure is 155/72, pulse 97,  respiration 18, 96% saturated on room air.  She is a thin female who  appears her stated age and does not appear in any distress.  She is  oriented x3.  Speech is clear.  Affect is bright.  She is alert,  cooperative and pleasant.  Follows commands without difficulty, answers  questions appropriately.   Cranial nerves grossly intact.  Coordination is intact.  Reflexes are  brisk in upper and lower extremities.  No abnormal tone or clonus or  tremors are appreciated.  Motor strength is in the 5/5 range without  focal  deficits.  No new sensory deficits are appreciated on exam.  She  has some minimal difficulty with tandem gait.  Romberg test performed  adequately.   Limitations are mildly noted in cervical range of motion, lumbar range  of motion is limited as well.  Full shoulder range of motion is  appreciated.   IMPRESSION:  1. Chronic right scapular pain, most likely secondary to nerve root      irritation, has a history of cervical spondylosis.  Recent MRI done      on July 29, 2008, attached to chart of the cervical spine.  2. Mild thoracic scoliosis.  3. Increased tone in upper and lower extremities, has a history of      small vessel ischemia per CT, March 2008.   PLAN:  We will trial her on some samples of Lidoderm and explained to  her how to use these 12 hours on 12 hours off, not more than three  patches applied at a time and do not apply to open areas.   She was given 4 samples today.  We will increase her Neurontin to 600 mg  1 p.o. q.i.d.  She will call me in 2 weeks and let me know how she is  doing on this.  We will see her back in a month; otherwise, may consider  adding amitriptyline  at night as well.  We will see her back in 1 month.  She has been stable on the above medications, I filled out a disability  form for her as well today indicating that she is able to sit less than  30 minutes at a time,  cannot sit for prolonged period or stand for a prolonged period or do  repetitive bending.  At this time, she is an eighth grade in education.  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           ______________________________  Katherine Bullock, M.D.     DMK/MedQ  D:  09/26/2008 28:41:32  T:  09/26/2008 21:21:44  Job #:  [id number]

## 2010-09-21 NOTE — Assessment & Plan Note (Signed)
Katherine Bullock returns to the clinic today for followup evaluation.  She is  now convinced that the methadone being used 4 times a day was not  causing her to pass out.  Apparently, it was the fault of the Lipitor.  She has continued to take the methadone 10 mg 3 times a day down from 4  times per day.  She notes that her pain is not as well controlled at the  3 time a day dosing, and she would like to return to 4 time a day  dosing.  Her Lipitor has been discontinued, and she is now on TriCor.   REVIEW OF SYSTEMS:  Positive for high blood sugar and limb swelling.   MEDICATIONS:  1. Methadone 10 mg 1 tablet t.i.d.  2. Aspirin 81 mg daily.  3. Lovaza 1 g 4 times a day.  4. Furosemide 20 mg 1 or 2 times per day p.r.n.  5. Metformin 1000 mg b.i.d.  6. Verapamil 240 mg daily.  7. Lisinopril 40 mg 2 times daily.  8. Clonidine 0.1 mg b.i.d.  9. Prozac 40 mg daily.  10.Pantoprazole 40 mg b.i.d.  11.Glimepiride 4 mg daily.  12.TriCor nightly.   PHYSICAL EXAM:  A reasonably well-appearing fit adult female in mild  acute discomfort.  Blood pressure 163/72 with a pulse of 75, respiratory rate 18, and O2  saturation 98% on room air.  She has 4+/5 strength throughout the bilateral upper and lower  extremities.   IMPRESSION:  1. 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  2. Myofascial pain syndrome.   In the office today, we did refill the patient's methadone at a q.i.d.  dosing instead of t.i.d.  She is prescribed 120 as of today.  She is  convinced that the methadone was not causing her the passing out  episodes, and she has not had those recur now that she is off Lipitor.  We will plan on seeing her in followup in approximately 3 to 4 months'  time with refills prior to that appointment if necessary.           ______________________________  Ellwood Dense, M.D.     DC/MedQ  D:  04/23/2007 10:53:43   T:  04/23/2007 14:58:00  Job #:  [id number]

## 2010-09-21 NOTE — Assessment & Plan Note (Signed)
Katherine Bullock is a 65 year old married woman who has been a patient of Dr.  Ellwood Dense.  She was last seen by Dr. Thomasena Edis in January 2010.   Dr. Thomasena Edis has been treating Katherine Bullock for chronic pain complaints,  which are located around her right shoulder blade.   She has had this pain in her right shoulder blade since approximately  2002.  She states she has been coming to this clinic since that time;  however, initially was taken care by Dr. Vear Clock and then by Dr.  Stevphen Rochester.  She states she has undergone radiofrequency neurotomy sometime  in 2003, but does not feel that it gave her any relief of her pain.   Her pain has been controlled on methadone up to 4 times a day.  She  states that with scheduling, she was concerned she would not get in to  have her methadone prescription replaced, and she has decreased the dose  that she was taking from 4 times a day to 3 times a day.  At the same  time, she mentions that she had been recently placed on a new diabetic  medication, which she cannot recall the name of by Dr. Abner Greenspan in  [place].  She states that after she was placed on the new diabetic  medication, she developed quite a bit of lethargy and Dr. Yetta Flock told  her that the medication may have increased her overall levels of the  methadone in her body.  Dr. Yetta Flock apparently since that time  discontinued the diabetic medication and restarted her on a new one.   For the last 5 or so days now, Katherine Bullock has been taking her methadone 10  mg three times a day rather than four times today.   Her last imaging study of her cervical spine was back in 2003.  At that  time showed mild degenerative changes in her cervical spine; however, a  soft lateral disk herniation was noted.  She also had an MRI of the  lumbar spine, which showed mild degenerative changes at L4-5, some  abnormal signal at L3 and this was followed up with a bone scan, which  showed some increased uptake up in the T6-T7,  but nothing at the L3  level.   Over the last several years, she has been managed on 10 mg of methadone  four times a day.   She indicates her average pain is about 7-8 on a scale of 10, localized  around the right shoulder blade.  The pain is constant, sharp, and  stabbing in nature.  Pain is worse with walking, sitting, and standing,  improves with medication.   FUNCTIONAL STATUS:  She uses a cane to walk because of some balance  problems.  She can walk about 10 minutes at a time.  She does not climb  stairs.  She is able to drive.  She is, otherwise independent with self-  care.   REVIEW OF SYSTEMS:  Positive for trouble walking and depression.  She  denies anxiety or suicidal ideation.  She reports that she does have  occasional bladder control problems up to 4-5 times a month and has had  a bladder tacking procedure.   Otherwise, review of systems is overall negative.  She did have some  variations in blood sugar and limb swelling recently.   PAST MEDICAL HISTORY:  Notable for diabetes, which is controlled with  oral medication, hypercholesterolemia, hypertension.  She recently had  some chest pain  and is currently being worked out and apparently has a  stress test planned in April 2010.   SURGICAL HISTORY:  Notable for tubal ligation, cholecystectomy,  appendectomy, and hysterectomy.   SOCIAL HISTORY:  The patient is married.  She has had five children who  are ages 46-48 currently.  She lives with her husband.  Denies drug use.  Denies alcohol use, and smokes a pack a day for approximately 40 years.   She mentioned that at one point when she was hospitalized, a blood test  showed she had marijuana in her system; however, she states that this  was due to some other medication that she was on.  She is unclear about  this particular part of her history, and I do not see anything in the  record at this time.   FAMILY HISTORY:  Positive for heart disease, diabetes,  and   hypertension.   MEDICATIONS:  She is currently on include the following, lisinopril,  Crestor, paroxetine, aspirin, Janumet, Lasix.   PHYSICAL EXAMINATION:  VITAL SIGNS:  Today, her blood pressure is  144/61, pulse 71, respiration 18, 98% saturated on room air.  GENERAL:  She is a well-developed, well-nourished woman who appears at  her stated age and does not appear in any distress.  She is oriented x3.  Speech is clear.  Affect is bright.  She is alert, cooperative,  pleasant.  Follows commands without difficulty, answers questions  appropriately.   Cranial nerves appear grossly intact.  Coordination in the upper  extremity appears to be grossly intact as well.  Her reflexes are brisk  in both upper and lower extremities and she has several beats of clonus  in both lower extremities sustained on the left.   Her motor strength is good without focal deficits.  Sensory exam reveals  diminished sensation in upper and lower extremities, non dermatomal.  She has decreased vibratory sense in both lower extremities.   Transitioning from sitting to standing is done without difficulty.  She  typically does use a cane.  She has some difficulty with tandem gait and  Romberg test.   Her gait otherwise displays good heel-toe mechanics, normal base of  support, and is overall stable in the room for short distance.   Limitations are noted in cervical range of motion, 60 degrees of  rotation to the right, 45 to the left.  She has full shoulder range of  motion.  She has tenderness to palpation throughout the cervical  paraspinal musculature.   IMPRESSION:  1. Chronic right scapular pain.  2. Chronic thoracic pain and thoracic scoliosis with myopathy.  3. Mild degenerative disk disease of the thoracic spine.  4. Mild fascial pain, mostly affecting left upper extremity.   PLAN:  We will start to wean methadone and plan to switch her to  something for neuropathic pain such as Lyrica or  Neurontin in the  upcoming weeks.  Currently, we will refill her methadone 5 mg one p.o.  daily x1 week and t.i.d. #49.  I will see her back in 2 weeks.  We will  review cervical MRI, cervical flexion and extension films, as well as  thoracic AP and lateral to assess any progression in thoracic scoliosis.  We will see her back in 2 weeks.  She is in agreement with this plan.           ______________________________  Brantley Stage, M.D.     DMK/MedQ  D:  07/28/2008 13:06:28  T:  07/29/2008 02:09:19  Job #:  [id number]

## 2010-09-21 NOTE — Assessment & Plan Note (Signed)
Ms. Jergens returns to the clinic today for followup evaluation. She  reports that she has been having spells where she is passing out and her  arm is jerking. She was apparently hospitalized and was found to have  possible side effect to the methadone. She had been on the methadone 10  mg four times a day for an extended period of time and experienced no  side effects. Treating physicians apparently felt that some of her  symptoms were related to the methadone and they asked her to decrease to  twice a day, which she is presently using. She plans to stop it  completely and monitor for persistence of her symptoms. She also reports  that her simvastatin was changed to another cholesterol medication and  her omeprazole was changed to another gastroesophageal reflux disease  medication. She is not sure exactly the names of these two new  medications for her.   REVIEW OF SYSTEMS:  Positive for high blood sugar, limb swelling,  respiratory infection and shortness of breath.   PHYSICAL EXAMINATION:  Reasonably well-appearing elderly adult female in  mild acute discomfort. Blood pressure 152/55 with a pulse of 63,  respiratory 16 and O2 saturation is 99% on room air.  She has 4+-5-/5 strength throughout.   IMPRESSION:  1. Chronic thoracic pain with thoracic scoliosis with myelopathy and      mixed degenerative disc disease of the thoracic spine.  2. Myofascial pain syndrome.   In the office today, the patient will be discontinued the methadone over  the next day or so. In the meantime, we will try oxycodone for her. She  has been on numerous medications in the past and we have had problems  relieving her pain with anything other than the methadone. That was  given to her more than two years ago and she has been very stable on  that dose for an extended period of time. It is still questionable  whether her recent symptoms are related to the methadone, but will try  her on oxycodone in place of  the methadone. She was placed on oxycodone  5 mg 1-2 tablets p.o. t.i.d. p.r.n. A total of 180 were prescribed. Will  plan on seeing the patient in followup in approximately 2-3 months time  with refills of the oxycodone as necessary.           ______________________________  Ellwood Dense, M.D.     DC/MedQ  D:  11/13/2006 14:56:41  T:  11/13/2006 20:19:42  Job #:  [id number]

## 2010-09-24 NOTE — Procedures (Signed)
[facility]  Patient:    Katherine Bullock, Katherine Bullock Visit Number: [id number] MRN: [id number]          Service Type: PMG Location: [facility] Attending Physician:  Rolly Salter Dictated by:   Jewel Baize Stevphen Rochester, M.D. Proc. Date: 11/13/01 Admit Date:  11/12/2001                             Procedure Report  [facility]ul[facility]. I evaluated and reviewed her health and history form and 14 point review of systems.  1. [name] comes in today and I have discussed the treatment limitations and    options with her. I do think she would benefit from a facetal injection,    but her pain is a little more a global presentation and I think it is    reasonable to start with a single cervical epidural to see how she does.    If she breaks through would consider facet injection, as a strategy for    long lasting relief cycling might be to consider neurotomy. I have    discussed this with her as well. 2. Lifestyle enhancements discussed such as cigarette cessation. 3. Physical therapy and home base therapy discussed. 4. Consider tennis technology or muscle stimulator.  We hope to place her in an enabling environment. I do not see her as a disabled individual at this time and would like to do what we can to improve her functional indices.  Objectively, she has diffuse paracervical myofascial discomfort, impaired flexion extension, lateral rotational pain but no new neurological features, motor, sensory or reflexive.  MRI of the cervical spine is reviewed with the patient.  IMPRESSION:  Cervicalgia, degenerative spinal disease of the cervical spine, cervical facet syndrome.  PLAN:  Cervical epidural. She has consented.  DESCRIPTION OF PROCEDURE:  The patient taken to the fluoroscopy suite and placed in upright position, neck prepped and draped in the usual fashion using a Hustead needle and advanced to the C5-6 interspace. No evidence of CSF, heme or  paresthesia. Test block uneventfully followed by 40 mg of Aristocort and flush the needle.  The patient tolerated the procedure well with no complications from the injections. Discharge instructions given. Dictated by:   Jewel Baize Stevphen Rochester, M.D. Attending Physician:  Rolly Salter DD:  11/13/01 TD:  11/16/01 Job: [id number]

## 2010-09-24 NOTE — Consult Note (Signed)
NAME:  Katherine Bullock, Katherine Bullock                            ACCOUNT NO.:  [id number]   MEDICAL RECORD NO.:  [id number]                   PATIENT TYPE:  REC   LOCATION:  [facility]                                  FACILITY:  [facility]   PHYSICIAN:  Zachary George, DO                      DATE OF BIRTH:  1946-05-04   DATE OF CONSULTATION:  08/20/2002  DATE OF DISCHARGE:                                   CONSULTATION   HISTORY OF PRESENT ILLNESS:  The patient returns to clinic today for a  reevaluation.  She was last seen on July 25, 2002.  At that time, I gave  her a trial of Flexeril 5 mg three times per day for her thoracic back pain  with myofascial component and muscle tightness and occasional spasms.  She  continues to complain of thoracic region back pain without any relief of her  spasms with Flexeril.  Her pain remains an 8/10 on a subjective scale.  She  has tried multiple medications including opiate analgesics, nonsteroidal  anti-inflammatory medications, and the Flexeril without any improvement in  her symptoms.  She has also undergone various interventional procedures to  include trigger point injections, facet joint injections, epidural steroid  injections.  She has also undergone osteopathic manipulative therapy.  She  has used a Forensic scientist.  All of these interventions have  essentially failed in relieving her pain.  She denies any chest pain,  fevers, chills, or weight loss.  She has occasional night sweats but  attributes this to hormone changes.  She continues to follow with her  primary care doctor, Dr. Barnabas Lister, and mentions that Dr. Barnabas Lister is considering  running some cardiac tests at some point and I think this stems from the  patient's significantly elevated triglycerides and cholesterol that she  reports today.  I reviewed health and history form and 14-point review of  systems.  Essentially no changes.  No new neurologic complaints.   PHYSICAL EXAMINATION:  GENERAL:  A  healthy female in no acute distress.  VITAL SIGNS:  Blood pressure is 158/69, pulse 70, respirations 20.  O2  saturation 100% on room air.  NEUROLOGIC:  Mood is depressed, affect is flat.  Examination of the back  reveals flattened thoracic kyphosis with tenderness to palpation in the  thoracic paraspinous muscles between T5-9, which is essentially unchanged  from previous.  This does reproduce the patient's symptoms.  Manual muscle  testing is 5/5 bilateral lower extremities.  Sensory examination is intact  to light touch bilateral lower extremities.  Muscle stretch reflexes are  2+/4 bilateral patellar, medial ham strings, and Achilles today.  No  abnormal tone noted in the lower extremities.  No ankle clonus noted today.   IMPRESSION:  1. Thoracic back pain, chronic.  2. Thoracic spondylosis without myelopathy.  3. 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  4. Myofascial pain syndrome component.   PLAN:  1. I discussed further treatment options with the patient.  From my     perspective, options in regards to her pain are somewhat limited.  I do     encourage her to continue with a home exercise program.  I will go ahead     and give her a trial of Skelaxin 800 mg one p.o. t.i.d. to q.i.d. as     needed.  I provide her with 28 sample pills, as well as a prescription     for 100 pills with one refill to be filled if symptoms improve with the     sample pills.  If the patient gets no relief with the Skelaxin, I think     we really need to start focusing more on a behavioral approach to     treating her pain.  She does have a history of depression and I think a     behavioral health psychology consult would be warranted to assist with     coping strategies.  I also recommend that she continue to follow up with     her primary care Casia Corti.  2. The patient to return to clinic in two months for reevaluation.   The patient was educated on the above findings and recommendations  and  understands.  There were no barriers to communication.                                               Zachary George, DO    JW/MEDQ  D:  08/20/2002  T:  08/20/2002  Job:  [id number]   cc:   Lewis Moccasin  [address]  [place] [address]  Fax: [phone]

## 2010-09-24 NOTE — H&P (Signed)
[facility]  Patient:    Katherine Bullock, Katherine Bullock Visit Number: [id number] MRN: [id number]          Service Type: PMG Location: [facility] Attending Physician:  Thyra Breed Adm. Date:  [id number]   CC:         Stefanie Libel, M.D.   History and Physical  FOLLOWUP EVALUATION:  Katherine Bullock comes in for followup evaluation of her thoracic spine pain with underlying thoracic spondylosis with radiation into the right infrascapular region.  Since her last evaluation, the patient has had ongoing pain in the infrascapular area and has not noted that the Duragesic has been of much benefit.  She continues with her Zoloft.  She actually has had side-effects to Duragesic including abdominal cramping and gas and would like to go off of it.  She has noted no improvement in her pain control.  She has been on many anti-inflammatory and muscle-relaxant agents in the past with no sustained improvement but does not recall being on Baclofen.  She does not recall a trial of a TENS unit.  Activities tend to increase her discomfort.  PHYSICAL EXAMINATION:  VITAL SIGNS:  Blood pressure 142/45.  Heart rate is 69.  Respiratory rate is 16.  O2 saturation is 98%.  Pain level is 6/10.  NEUROLOGIC:  Deep tendon reflexes were symmetric in the upper and lower extremities.  Straight leg raise signs are negative.  LUNGS:  Clear.  BACK:  She demonstrates tenderness to percussion over the mid-thoracic vertebrae.  IMPRESSION: 1. Thoracic pain syndrome on the basis of thoracic spondylosis with radiation into the right infrascapular region. 2. Other medical problems per Dr. Austin Miles. [place].  DISPOSITION: 1. Remain out of work for at least eight weeks. 2. Stop Duragesic. 3. Percocet 5/325 mg one to two p.o. q.6h. p.r.n. 4. Baclofen 10 mg 1/2 tablet b.i.d. x 3 days, then 1/2 tablet 3 times a day if    she tolerates this; she was given a prescription for 60 with 2 refills.    She was given additional  information on the side-effects, which were    reviewed with her. 5. Follow up with me in four weeks. 6. Consider trial of TENS unit if she is not improved at her next visit. DD:  12/25/00 TD:  12/25/00 Job: [id number]

## 2010-09-24 NOTE — Op Note (Signed)
NAME:  Katherine Bullock, Katherine Bullock                            ACCOUNT NO.:  [id number]   MEDICAL RECORD NO.:  [id number]                   PATIENT TYPE:  REC   LOCATION:  [facility]                                  FACILITY:  [facility]   PHYSICIAN:  Celene Kras, MD                     DATE OF BIRTH:  1945-08-22   DATE OF PROCEDURE:  DATE OF DISCHARGE:                                 OPERATIVE REPORT   The patient comes in to the center of pain management today.  I evaluated  her.  History form and 14 point review of systems.   1. I had extensive conversation with her regarding the previous facetal     injection, thoracic spine, right and left side at T6, 7, and 8.  She was     afforded relief, and has broken through.  She finds this to be superior     to the thoracic epidural, with notable increase in function.  I think it     is reasonable from a risk award prospective, as well as functional     enhancement prospective to proceed with neurotomy.  Rationale is that she     is contemplating disability which we would like to discourage, she is a     fairly vital individual, and at least some time of vocational retraining     should be trialed.  We have discussed this with her.  2. We also want to minimize escalation of narcotic based pain medication.     She really did not get any benefit from Avinza so I will go ahead and     switch her our to Norco and I am going to monitor this medication, as she     understands the potential habituating nature, and another rationale to     perform neurotomy is to potentially remove away from narcotic based pain     medication.  3. I will see her in follow up.  She understands that the risk of this     procedure includes nerve damage, pneumothorax, escalation of pain,     bleeding, infection or no effect at all.  She also understands that there     is potential to have to reperform this procedure, and extend in a thermal     fashion.  We plan pulse technology today.    OBJECTIVE:  She has typical diffuse prothoracic and myofascial discomfort.  Impaired flexion and extension and lateral rotational pain.  She has no  advancing neurological features.  She is stable on symptom presentation.   IMPRESSION:  1. Degenerative spinal disease thoracic spine.  2. Facet syndrome.  3. Myofacial pain syndrome.  4. Portable health characteristic.  5. Cigarettes.   PLAN:  1. Again lifestyle enhancements discussed for best out come.  2. Go ahead and switch her to hydrocodone with full disclosure and review of  patient care agreement.  3. Obtain FCE.  She asked me to fill out a rather lengthy functional     assessment, and I really do not think that is appropriate. I would rather     have her obtain an objective assessment rather than guess work, and this     is discussed with her.  4. I will go ahead and perform neurotomy and follow up with her in about a     month or a month and half.  She is instructed to give [place] a call if she     has any problems in the interim.  She has consented for today's     procedure.   We plan right and left sided T6, 7 and 8 with contributory innervation  addressed.  She has consented.   The patient was taken to the procedure suite and placed in prone position.  The back prepped and draped in the usual fashion.  Using a 22 gauge RF  needle, I advanced to the facet of thoracic T6, 7 and 8 and confirmed  placement of multiple fluoroscopic positions.  I used Isovue 200.  I  appropriately stimulated both motor and sensory.  I then followed with 1 cc  of Lidocaine and 1% FEF to each location with a total of 40 mg of Aristocort  in divided dose.   Lesion is performed at pulse manner at 100 seconds, 42 degrees, and she  tolerated this well at each location.   Appropriate recovery.  Versed sedation by Dr. Stevphen Rochester without any  complications.  Discharge instructions given.  No complications identified.  She is alert with no shortness of  breath or respiratory compromise at  discharge.  Discharge instructions are given.  Lifestyle enhancements  discussed.  We see her in follow up.                                                Celene Kras, MD    HH/MEDQ  D:  04/10/2002  T:  04/10/2002  Job:  [id number]

## 2010-09-24 NOTE — Discharge Summary (Signed)
[facility]  Patient:    CARLA, RASHAD Visit Number: [id number] MRN: [id number]          Service Type: PMG Location: [facility] Attending Physician:  Sondra Come Dictated by:   Sondra Come, D.O. Admit Date:  04/20/2001                             Discharge Summary  HISTORY OF PRESENT ILLNESS:  The patient returns to the clinic today as scheduled for reevaluation.  She was last seen on 06/20/01.  I performed osteopathic manipulative therapy on her on last visit.  The patient notes no significant improvement after the manipulative therapy.  She denies any postmanipulative pain flare.  She continues to have pain in her right thoracic paraspinal region at approximately T7.  No significant changes.  I reviewed health and history form and 14-point review of systems.  PHYSICAL EXAMINATION:  GENERAL:  Reveals a healthy female in no acute distress.  VITAL SIGNS:  Blood pressure is 163/69, pulse 83, respirations 12, O2 saturation 98% on room air.  SPIN:  Reveals significant tightness in the upper back, parascapular and cervicothoracic paraspinal musculature.  The patient has significant tenderness to palpation just lateral to the T5 through T7 spinous processes on the right in the thoracic paraspinous region.  There is decreased rotational component.  No new neurologic findings in the upper extremities and lower extremities.  IMPRESSION: 1. Somatic dysfunction thoracic spine with underlying degenerative disk    disease of the thoracic spine with myofacial component.  PLAN: 1. Osteopathic manipulative therapy to the thoracic spine to include    myofacial release and high velocity low amplitude techniques.    Postmanipulation, it is noted the patient has decreased muscle tightness    in the thoracic paraspinous region.  Discharge instructions were given. 2. Continue neuromuscular stimulator for now. 3. Continue methadone as prescribed. 4. Continue home  exercise program. 5. The patient is to return in one week for reevaluation and further    manipulative therapy.  The patient was educated on the above findings and recommendations and understands.  There were no barriers of communication. Dictated by:   Sondra Come, D.O. Attending Physician:  Sondra Come DD:  06/27/01 TD:  06/28/01 Job: [id number]

## 2010-11-22 ENCOUNTER — Ambulatory Visit: Payer: Medicare Other | Admitting: Physical Medicine and Rehabilitation

## 2010-12-08 ENCOUNTER — Encounter
Payer: Medicare Other | Attending: Physical Medicine and Rehabilitation | Admitting: Physical Medicine and Rehabilitation

## 2010-12-08 DIAGNOSIS — M546 Pain in thoracic spine: Secondary | ICD-10-CM

## 2010-12-08 DIAGNOSIS — M47812 Spondylosis without myelopathy or radiculopathy, cervical region: Secondary | ICD-10-CM | POA: Insufficient documentation

## 2010-12-08 DIAGNOSIS — R209 Unspecified disturbances of skin sensation: Secondary | ICD-10-CM | POA: Insufficient documentation

## 2010-12-08 DIAGNOSIS — M4130 Thoracogenic scoliosis, site unspecified: Secondary | ICD-10-CM | POA: Insufficient documentation

## 2010-12-08 DIAGNOSIS — G894 Chronic pain syndrome: Secondary | ICD-10-CM

## 2010-12-08 DIAGNOSIS — M549 Dorsalgia, unspecified: Secondary | ICD-10-CM | POA: Insufficient documentation

## 2010-12-08 DIAGNOSIS — R279 Unspecified lack of coordination: Secondary | ICD-10-CM | POA: Insufficient documentation

## 2010-12-08 DIAGNOSIS — M47814 Spondylosis without myelopathy or radiculopathy, thoracic region: Secondary | ICD-10-CM | POA: Insufficient documentation

## 2010-12-08 NOTE — Assessment & Plan Note (Signed)
Katherine Bullock is a pleasant 65 year old married woman, who is followed here at [facility] for chronic pain complaints of her periscapular to mid back region.  She has known cervical spondylosis, mild thoracic scoliosis/spondylosis.  She also has past medical history, which is significant for diabetes mellitus type 2, peripheral neuropathy, as well as small vessel ischemic changes per brain MRI and mild balance disorder.  She is back in today.  She reports her average pain is about 8 on a scale of 10, continues to be localized around the parascapular region and lateral to the right upper to mid-thoracic region, interferes with sleep at times.  Pain is worse with walking, standing, improves with medication somewhat.  She does use a cane, especially on even services and does not climb stairs.  She can drive.  She is independent with self-care.  No problems with respect to bowel or bladder.  Denies suicidal ideation. Admits to some depression.  REVIEW OF SYSTEMS:  Positive for night sweats, variations in blood sugar and limb swelling.  I asked her to follow up with primary care for these issues.  Primary care physician is Dr. Abner Greenspan.  No other change in past medical, social, or family history.  She continues to smoke a pack of cigarettes a day, cautioned against this.  [facility]ications [facility] include gabapentin 600 mg 4 times a day.  It had been written out for 5 times a day; however, she is currently taking it 4 times a day.  She is also using Soma 350 mg at night.  She has been off hydrocodone since early spring.  PHYSICAL EXAMINATION:  VITAL SIGNS:  On exam today, her blood pressure is 173/54, pulse is 65, respirations 16, 99% saturation on room air. GENERAL:  She is well-developed, well-nourished woman, who appears her stated age and does not appear in any distress. NEUROLOGIC:  She is oriented x3.  Speech is clear.   Affect is bright. She is alert, cooperative, and pleasant.  Follows commands without difficulty.  Answers my questions appropriately.  Cranial nerves and coordination are grossly intact.  Her reflexes are slightly brisk, which is not new.  Her strength is good in both upper and lower extremities without focal deficits.  She transitions from sitting to standing easily.  Gait in the room is normal, tandem gait and Romberg test are performed adequately.  She has minimal limitations with respect to cervical range of motion or shoulder range.  At this time, some tenderness is noted in the paraspinal musculature adjacent to her scapula on the right.  IMPRESSION: 1. History of cervical spondylosis. 2. Mild thoracic scoliosis/spondylosis. 3. Myofascial component of parascapular pain. 4. History of bilateral foot numbness, which is consistent with     diabetic and peripheral neuropathy. 5. Mild balance disorder, which does not affect her functionally.  Her medical problems include: 1. Diabetes mellitus type 2, on insulin. 2. Hypercholesterolemia. 3. Hypertension. 4. Nicotine addiction. 5. Small vessel ischemic changes per brain MRI from 2008.  PLAN:  I have reviewed treatment options again with Ms. Bergeman.  She is interested in trialing another medication to help with her pain.  She would like to discontinue gabapentin and give Lyrica trial.  I have written her a prescription to wean down off gabapentin followed by starting dose of Lyrica 25 mg b.i.d.  I have also refilled her Soma, which she takes 1 pill at night for muscle spasm.  She is also interested  in possibly trialing Skelaxin for severe muscle spasms during the day in the upcoming months as well.  I will see her back in 6 weeks.  She will let me know how she does on the medication change.  I reviewed the risks and benefits of this medicine with her, and she is comfortable with our treatment plan at this time.     Brantley Stage, M.D. Electronically Signed    DMK/MedQ D:  12/08/2010 09:52:19  T:  12/08/2010 10:29:30  Job #:  [id number]

## 2011-01-19 ENCOUNTER — Encounter
Payer: Medicare Other | Attending: Physical Medicine and Rehabilitation | Admitting: Physical Medicine and Rehabilitation

## 2011-01-19 DIAGNOSIS — I10 Essential (primary) hypertension: Secondary | ICD-10-CM | POA: Insufficient documentation

## 2011-01-19 DIAGNOSIS — M542 Cervicalgia: Secondary | ICD-10-CM | POA: Insufficient documentation

## 2011-01-19 DIAGNOSIS — F17200 Nicotine dependence, unspecified, uncomplicated: Secondary | ICD-10-CM | POA: Insufficient documentation

## 2011-01-19 DIAGNOSIS — Z794 Long term (current) use of insulin: Secondary | ICD-10-CM | POA: Insufficient documentation

## 2011-01-19 DIAGNOSIS — Z79899 Other long term (current) drug therapy: Secondary | ICD-10-CM | POA: Insufficient documentation

## 2011-01-19 DIAGNOSIS — M4120 Other idiopathic scoliosis, site unspecified: Secondary | ICD-10-CM | POA: Insufficient documentation

## 2011-01-19 DIAGNOSIS — R279 Unspecified lack of coordination: Secondary | ICD-10-CM | POA: Insufficient documentation

## 2011-01-19 DIAGNOSIS — G8929 Other chronic pain: Secondary | ICD-10-CM | POA: Insufficient documentation

## 2011-01-19 DIAGNOSIS — E7800 Pure hypercholesterolemia, unspecified: Secondary | ICD-10-CM | POA: Insufficient documentation

## 2011-01-19 DIAGNOSIS — M25519 Pain in unspecified shoulder: Secondary | ICD-10-CM | POA: Insufficient documentation

## 2011-01-19 DIAGNOSIS — E119 Type 2 diabetes mellitus without complications: Secondary | ICD-10-CM | POA: Insufficient documentation

## 2011-01-19 DIAGNOSIS — G894 Chronic pain syndrome: Secondary | ICD-10-CM

## 2011-01-19 DIAGNOSIS — R209 Unspecified disturbances of skin sensation: Secondary | ICD-10-CM | POA: Insufficient documentation

## 2011-01-19 DIAGNOSIS — M47812 Spondylosis without myelopathy or radiculopathy, cervical region: Secondary | ICD-10-CM | POA: Insufficient documentation

## 2011-01-19 DIAGNOSIS — M47814 Spondylosis without myelopathy or radiculopathy, thoracic region: Secondary | ICD-10-CM | POA: Insufficient documentation

## 2011-01-19 NOTE — Assessment & Plan Note (Signed)
[name] is a pleasant 65 year old married woman who is followed here at [facility] for chronic pain complaints related to her right periscapular region and neck.  Her average pain is about a 7 on a scale of 10, described as sharp and stabbing in nature.  Sleep tends to be poor.  Pain is worse with activities and prolonged sitting and improves with medication.  She has recently been switched from gabapentin to Lyrica.  She seems to be doing well with this.  FUNCTIONAL STATUS:  She can walk about 10 minutes.  She has difficulty with stairs.  She does drive a little bit.  She is independent with self- care and needs assistance with heavier household tasks.  Denies problems controlling bowel or bladder.  Admits to some depression, but denies suicidal ideation.  Her primary care physician is Dr. Abner Greenspan.  She maintains contact with her.  No other change in past medical, social, or family history.  PHYSICAL EXAMINATION:  Her blood pressure is 157/54, pulse 64, respirations 16, and 100% saturated on room air.  She is a well- developed, well-nourished woman who appears her stated age and does not appear in any distress.  She is oriented x3.  Speech is clear.  Affect is bright.  She is alert, cooperative, and pleasant.  Follows commands without difficulty.  Answers my questions appropriately.  Cranial nerves and coordination are intact.  Reflexes are tended to be a little bit brisk which is not new.  Her strength is good in both upper and lower extremities without abnormal tone, clonus, or tremors.  She is able to transition easily from sitting to standing.  Gait is antalgic.  Tandem gait and Romberg test are performed adequately.  She has minimal limitations with respect to cervical range of motion or shoulder range of motion.  She does have a good amount of parascapular tenderness with palpation especially over the right scapula and adjacent to  the medial border of the right scapula.  IMPRESSION: 1. Cervical spondylosis. 2. Mild thoracic scoliosis/spondylosis. 3. Myofascial component of parascapular pain. 4. Bilateral foot numbness consistent with diabetic/peripheral     neuropathy. 5. Mild balance disorder which does not affect her functionally.  Her medical problems include diabetes type 2 on insulin, hypercholesterolemia, hypertension, nicotine addiction, and history of small vessel ischemic changes per brain MRI from 2008.  PLAN:  She had been recently switched from gabapentin to Lyrica.  She was on 25 mg twice a day last month.  I am going to increase it slightly.  She did get some relief of her pain.  She went from typical an 8-9 on a scale of 10 down to about a 7.  She is comfortable with this change in her medications.  She also uses Soma once at night for muscle spasms as well.  She has been taking her medications as prescribed. Does not give any evidence of aberrant behavior.  She is currently not on any narcotics at this time.  I will see her back in 3 months.    Brantley Stage, M.D. Electronically Signed   DMK/MedQ D:  01/19/2011 13:39:14  T:  01/19/2011 15:21:14  Job #:  [id number]

## 2011-04-20 ENCOUNTER — Ambulatory Visit: Payer: Medicare Other | Admitting: Physical Medicine and Rehabilitation

## 2011-04-22 ENCOUNTER — Ambulatory Visit: Payer: Medicare Other | Admitting: Physical Medicine and Rehabilitation

## 2011-05-23 ENCOUNTER — Encounter
Payer: Medicare Other | Attending: Physical Medicine and Rehabilitation | Admitting: Physical Medicine and Rehabilitation

## 2011-05-23 DIAGNOSIS — Z794 Long term (current) use of insulin: Secondary | ICD-10-CM | POA: Insufficient documentation

## 2011-05-23 DIAGNOSIS — E7800 Pure hypercholesterolemia, unspecified: Secondary | ICD-10-CM | POA: Insufficient documentation

## 2011-05-23 DIAGNOSIS — Z79899 Other long term (current) drug therapy: Secondary | ICD-10-CM | POA: Insufficient documentation

## 2011-05-23 DIAGNOSIS — M47812 Spondylosis without myelopathy or radiculopathy, cervical region: Secondary | ICD-10-CM | POA: Insufficient documentation

## 2011-05-23 DIAGNOSIS — G894 Chronic pain syndrome: Secondary | ICD-10-CM

## 2011-05-23 DIAGNOSIS — R209 Unspecified disturbances of skin sensation: Secondary | ICD-10-CM | POA: Insufficient documentation

## 2011-05-23 DIAGNOSIS — M25519 Pain in unspecified shoulder: Secondary | ICD-10-CM | POA: Insufficient documentation

## 2011-05-23 DIAGNOSIS — G8929 Other chronic pain: Secondary | ICD-10-CM | POA: Insufficient documentation

## 2011-05-23 DIAGNOSIS — R279 Unspecified lack of coordination: Secondary | ICD-10-CM | POA: Insufficient documentation

## 2011-05-23 DIAGNOSIS — E119 Type 2 diabetes mellitus without complications: Secondary | ICD-10-CM | POA: Insufficient documentation

## 2011-05-23 DIAGNOSIS — M4120 Other idiopathic scoliosis, site unspecified: Secondary | ICD-10-CM | POA: Insufficient documentation

## 2011-05-23 DIAGNOSIS — F17200 Nicotine dependence, unspecified, uncomplicated: Secondary | ICD-10-CM | POA: Insufficient documentation

## 2011-05-23 DIAGNOSIS — M542 Cervicalgia: Secondary | ICD-10-CM

## 2011-05-23 DIAGNOSIS — I10 Essential (primary) hypertension: Secondary | ICD-10-CM | POA: Insufficient documentation

## 2011-05-24 NOTE — Assessment & Plan Note (Signed)
Katherine Bullock is a pleasant 66 year old married woman who is followed here at the [facility] for chronic pain complaints related to her right periscapular region.  Her average pain is about 8 on a scale of 10, interferes a lot with activity levels. Sleep is poor.  Pain is worse with walking, standing, sitting, improves with medication.  She reports a little-to-fair relief with current meds. Pain is described as sharp and stabbing in nature.  MEDICATIONS PRESCRIBED:  Lyrica 25 mg 3 times a day and Soma 1 p.o. at bedtime.  FUNCTIONAL STATUS:  She walks using a cane.  She has difficulty with stairs.  She does drive.  She is independent with self-care.  REVIEW OF SYSTEMS:  Positive for some depression.  Denies suicidal ideation.  She also has some other complaints such as night sweats, high blood sugar, and limb swelling.  I asked her to follow up with primary care for these.  PAST MEDICAL, SOCIAL AND FAMILY HISTORY:  Unchanged since previous visit.  She continues to smoke a pack of cigarette a day, cautioned against this.  PHYSICAL EXAMINATION:  Blood pressure is 156/54, pulse 49, respiration 18, 94% saturated on room air.  She is a well-developed and well- nourished woman, 5 foot and 4 inches, 145 pounds.  She is oriented x3. Speech is clear.  Affect is bright.  She is alert, cooperative, and pleasant.  Follows commands without difficulty.  Answers my questions appropriately.  Cranial nerves and coordination are intact.  Reflexes are 1+ in upper and lower extremities without abnormal tone, clonus, or tremors.  Motor strength is good in upper as well as lower extremities, 5/5 without focal deficit.  No sensory deficits are noted as well.  Transitioning from sitting to standing is done without difficulty. Tandem gait and Romberg test are performed with ease.  Mild limitations in cervical range of motion.  Mild lumbar motion deficits are appreciated  as well.  IMPRESSION: 1. Cervical spondylosis. 2. Mild thoracic scoliosis/spondylosis. 3. myofascial component of periscapular pain. 4. History of bilateral foot numbness consistent with     diabetic/peripheral neuropathy. 5. Mild balance disorder.  MEDICAL PROBLEMS: 1. Diabetes type 2, on insulin. 2. Hypercholesterolemia. 3. Hypertension. 4. Nicotine addiction. 5. History of small vessel ischemic changes in brain per MRI in 2008.  PLAN:  She brings in a formulary today.  Her insurance will no longer pay for Soma.  She has two choices of muscle relaxers that will be paid for through her insurance and that includes Flexeril, which she has tried and causes itching.  She has not tried Robaxin and she would like to see how that works for her.  I will write a prescription of Robaxin 500 1 p.o. at bedtime p.r.n. muscle spasm.  I will refill her Lyrica today and increase it slightly 25 mg 2 tablets b.i.d.  I have asked her if she has any problems with this slightly increase dose to drop back to the dose that she has been previously, which is 25 mg 3 times a day.  I have also noted she does seem somewhat more depressed, I am wondering if this is impacting her pain as well.  I have discussed the consideration of trial of Cymbalta.  I have brought this up today.  She is thinking about it.  She may consider it, I will bring it up again next month.  Review of risk and benefits of this medication with her as well.  I will  see her back in 1 month.     Brantley Stage, M.D. Electronically Signed    DMK/MedQ D:  05/23/2011 09:53:44  T:  05/24/2011 03:09:42  Job #:  [id number]

## 2011-06-22 ENCOUNTER — Encounter
Payer: Medicare Other | Attending: Physical Medicine and Rehabilitation | Admitting: Physical Medicine and Rehabilitation

## 2011-06-22 DIAGNOSIS — M47814 Spondylosis without myelopathy or radiculopathy, thoracic region: Secondary | ICD-10-CM | POA: Insufficient documentation

## 2011-06-22 DIAGNOSIS — G8929 Other chronic pain: Secondary | ICD-10-CM | POA: Insufficient documentation

## 2011-06-22 DIAGNOSIS — E7800 Pure hypercholesterolemia, unspecified: Secondary | ICD-10-CM | POA: Insufficient documentation

## 2011-06-22 DIAGNOSIS — M4120 Other idiopathic scoliosis, site unspecified: Secondary | ICD-10-CM | POA: Insufficient documentation

## 2011-06-22 DIAGNOSIS — R279 Unspecified lack of coordination: Secondary | ICD-10-CM | POA: Insufficient documentation

## 2011-06-22 DIAGNOSIS — M542 Cervicalgia: Secondary | ICD-10-CM

## 2011-06-22 DIAGNOSIS — Z794 Long term (current) use of insulin: Secondary | ICD-10-CM | POA: Insufficient documentation

## 2011-06-22 DIAGNOSIS — I10 Essential (primary) hypertension: Secondary | ICD-10-CM | POA: Insufficient documentation

## 2011-06-22 DIAGNOSIS — M47812 Spondylosis without myelopathy or radiculopathy, cervical region: Secondary | ICD-10-CM | POA: Insufficient documentation

## 2011-06-22 DIAGNOSIS — F17200 Nicotine dependence, unspecified, uncomplicated: Secondary | ICD-10-CM | POA: Insufficient documentation

## 2011-06-22 DIAGNOSIS — E1149 Type 2 diabetes mellitus with other diabetic neurological complication: Secondary | ICD-10-CM | POA: Insufficient documentation

## 2011-06-22 DIAGNOSIS — G894 Chronic pain syndrome: Secondary | ICD-10-CM

## 2011-06-22 DIAGNOSIS — E1142 Type 2 diabetes mellitus with diabetic polyneuropathy: Secondary | ICD-10-CM | POA: Insufficient documentation

## 2011-06-23 NOTE — Assessment & Plan Note (Signed)
HISTORY OF PRESENT ILLNESS:  [name] is a pleasant 66 year old married woman who is followed here at [facility] for chronic pain complaints related to her right parascapular pain.  She has known cervical as well as thoracic spondylosis.  She is back in today.  She has trialed Robaxin.  She tells me it keeps her up at night, and she has stopped using it.  She does report that the Lyrica, which was 25 mg 3 times a day and increased to 50 mg twice a day, has been somewhat beneficial.  Average pain is about 8 on a scale of 10.  Pain is constant, again located around her right scapular area, nonradiating into the extremities, nonradiating into the neck or head.  Pain is described as sharp and stabbing at times.  Sleep is poor.  Pain is worse with walking, sitting, standing, and improves with medication.  She reports a little relief with current meds.  FUNCTIONAL STATUS:  She walks with a cane.  She does drive.  She is independent with self-care, needs assistance with heavier household tasks.  REVIEW OF SYSTEMS:  Negative for bowel or bladder problems.  Denies suicidal ideation.  She does report some depression trouble and trouble walking.  She uses a cane.  Also reports night sweats, limb swelling, asked to follow up with primary care for this.  Past medical, social, and family history unchanged.  She continues to smoke a pack a day and I have cautioned her against this.  PHYSICAL EXAMINATION:  VITAL SIGNS:  Today blood pressure is 164/67, pulse is 51, respirations 14, 98% saturated on room air.  She is 64 inches tall and 148 pounds. GENERAL:  Well-developed, well-nourished woman who appears her stated age.  Does not appear in any distress.  She is oriented x3. NEUROLOGIC:  Speech is clear.  Affect is bright.  She is alert, cooperative, and pleasant.  Follows commands without difficulty. Answers questions appropriately.  Cranial nerves and  coordination are intact.  Reflexes are slightly brisk in the upper and lower extremities without abnormal tone, clonus, or tremors.  Motor strength is good in both upper and lower extremities. No focal deficits appreciated.  No sensory deficits noted.  Transitions easily from sitting to standing.  Gait is non-antalgic. Tandem gait and Romberg tests are performed without difficulty.  Some mild limitations are noted in cervical range of motion as well as lumbar motion.  Relatively well-preserved shoulder motion is appreciated.  IMPRESSION: 1. Cervical spondylosis. 2. Mild thoracic scoliosis/spondylosis. 3. Myofascial component, periscapular pain. 4. History of diabetic peripheral neuropathy, which is stable. 5. Mild balance disorder.  Other problems include diabetes type 2, on insulin, hypercholesterolemia, hypertension, nicotine addiction, small vessel ischemic changes per brain MRI 2008.  PLAN:  She has found that the Flexeril has not been very helpful for her muscle spasms.  She has tried Robaxin that keeps her up at night.  She is requesting to return back to [place].  She has stated she understands that it is not covered by insurance and she will pay for it out of pocket.  We will continue her on Lyrica 25 mg 2 tablets b.i.d.  Continue to monitor for any kind of problems with increased edema in the lower extremities.  We will also give her a prescription for Soma 350 mg 1 p.o. at bedtime p.r.n. muscle spasms.  She is currently comfortable with this plan.  Her last MRI of her cervical spine was on July 29, 2008 showing spondylosis without significant stenosis.  We will continue to monitor her pain medication usage.  She takes her medications as prescribed.  No evidence of aberrant behavior.  She is comfortable with our current plan.  I will see her back in 3 months.     Brantley Stage, M.D.    DMK/MedQ D:  06/22/2011 11:48:52  T:  06/23/2011 07:12:36  Job #:   [id number]

## 2011-09-19 ENCOUNTER — Encounter
Payer: Medicare Other | Attending: Physical Medicine and Rehabilitation | Admitting: Physical Medicine and Rehabilitation

## 2011-09-19 ENCOUNTER — Encounter: Payer: Self-pay | Admitting: Physical Medicine and Rehabilitation

## 2011-09-19 VITALS — BP 204/95 | HR 56 | Resp 16 | Ht 64.0 in | Wt 150.0 lb

## 2011-09-19 DIAGNOSIS — E1142 Type 2 diabetes mellitus with diabetic polyneuropathy: Secondary | ICD-10-CM | POA: Insufficient documentation

## 2011-09-19 DIAGNOSIS — F17200 Nicotine dependence, unspecified, uncomplicated: Secondary | ICD-10-CM | POA: Insufficient documentation

## 2011-09-19 DIAGNOSIS — Z794 Long term (current) use of insulin: Secondary | ICD-10-CM | POA: Insufficient documentation

## 2011-09-19 DIAGNOSIS — G8929 Other chronic pain: Secondary | ICD-10-CM

## 2011-09-19 DIAGNOSIS — M25519 Pain in unspecified shoulder: Secondary | ICD-10-CM

## 2011-09-19 DIAGNOSIS — E1149 Type 2 diabetes mellitus with other diabetic neurological complication: Secondary | ICD-10-CM | POA: Insufficient documentation

## 2011-09-19 DIAGNOSIS — E7800 Pure hypercholesterolemia, unspecified: Secondary | ICD-10-CM | POA: Insufficient documentation

## 2011-09-19 DIAGNOSIS — I10 Essential (primary) hypertension: Secondary | ICD-10-CM | POA: Insufficient documentation

## 2011-09-19 DIAGNOSIS — M47812 Spondylosis without myelopathy or radiculopathy, cervical region: Secondary | ICD-10-CM | POA: Insufficient documentation

## 2011-09-19 DIAGNOSIS — M4120 Other idiopathic scoliosis, site unspecified: Secondary | ICD-10-CM | POA: Insufficient documentation

## 2011-09-19 DIAGNOSIS — M542 Cervicalgia: Secondary | ICD-10-CM | POA: Insufficient documentation

## 2011-09-19 DIAGNOSIS — R279 Unspecified lack of coordination: Secondary | ICD-10-CM | POA: Insufficient documentation

## 2011-09-19 DIAGNOSIS — M47814 Spondylosis without myelopathy or radiculopathy, thoracic region: Secondary | ICD-10-CM | POA: Insufficient documentation

## 2011-09-19 MED ORDER — GABAPENTIN 300 MG PO CAPS: 300.0000 mg | ORAL_CAPSULE | Freq: Three times a day (TID) | ORAL | Status: DC

## 2011-09-19 NOTE — Progress Notes (Signed)
Subjective:    Patient ID: Katherine Bullock, female    DOB: 1945/08/09, 66 y.o.   MRN: [id number]  HPI    Katherine Bullock is a pleasant 66 year old  married woman who is followed here at [facility] for chronic pain complaints related to her right parascapular  pain. She has known cervical as well as thoracic spondylosis.  She is back in today. She has trialed Robaxin. She tells me it keeps  her up at night, and she has stopped using it. She does report that the  Lyrica, which was 25 mg 3 times a day and increased to 50 mg twice a  day, has been somewhat beneficial.  Average pain is about 8 on a scale of 10. Pain is constant, again  located around her right scapular area, nonradiating into the  extremities, nonradiating into the neck or head.  Pain is described as sharp and stabbing at times. Sleep is poor. Pain  is worse with walking, sitting, standing, and improves with medication.  She reports a little relief with current meds.  FUNCTIONAL STATUS: She walks with a cane. She does drive. She is  independent with self-care, needs assistance with heavier household  tasks.  REVIEW OF SYSTEMS: Negative for bowel or bladder problems. Denies  suicidal ideation. She does report some depression trouble and trouble  walking. She uses a cane.  Also reports night sweats, limb swelling, asked to follow up with  primary care for this.  Past medical, social, and family history unchanged. She continues to  smoke a pack a day and I have cautioned her against this.          Pain Inventory Average Pain 8 Pain Right Now 8 My pain is sharp, stabbing and aching  In the last 24 hours, has pain interfered with the following? General activity 7 Relation with others 7 Enjoyment of life 8 What TIME of day is your pain at its worst? all the time Sleep (in general) Poor  Pain is worse with: walking, sitting and standing Pain improves with: pacing activities and  medication Relief from Meds: 3  Mobility use a cane how many minutes can you walk? 10 min ability to climb steps?  no do you drive?  yes Do you have any goals in this area?  no  Function disabled: date disabled  I need assistance with the following:  meal prep and household duties Do you have any goals in this area?  no  Neuro/Psych No problems in this area  Prior Studies Any changes since last visit?  no  Physicians involved in your care Any changes since last visit?  yes, pt has had stress test and more tests to come       Review of Systems  Constitutional: Positive for unexpected weight change.  HENT: Negative.   Eyes: Negative.   Respiratory: Negative.   Cardiovascular: Negative.   Gastrointestinal: Negative.   Genitourinary: Negative.   Musculoskeletal: Positive for joint swelling.  Neurological: Negative.   Hematological: Negative.   Psychiatric/Behavioral: Negative.        Objective:   Physical Exam  Well-developed, well-nourished woman who appears her stated  age. Does not appear in any distress. She is oriented x3.  NEUROLOGIC: Speech is clear. Affect is bright. She is alert,  cooperative, and pleasant. Follows commands without difficulty.  Answers questions appropriately.    Cranial nerves and coordination are intact.    Reflexes are slightly brisk in the  upper and lower extremities without abnormal tone, clonus, or  Tremors.    Motor strength is good in both upper and lower extremities. Slightly decreased strength 4+ tib anterior on right and 4+ hip flexor. No focal deficits appreciated. No sensory deficits noted.  Transitions easily from sitting to standing. Gait is non-antalgic.  Tandem gait and Romberg tests are performed without difficulty.  Some mild limitations are noted in cervical range of motion as well as  lumbar motion.  Relatively well-preserved shoulder motion is appreciated.        Assessment & Plan:  1. Cervical  spondylosis.  2. Mild thoracic scoliosis/spondylosis.  3. Myofascial component, periscapular pain.  4. History of diabetic peripheral neuropathy, which is stable.  5. Mild balance disorder.    Other problems include diabetes type 2, on insulin,  hypercholesterolemia, hypertension, nicotine addiction, small vessel  ischemic changes per brain MRI 2008.    PLAN: She has found that the Flexeril has not been very helpful for her  muscle spasms. She has tried Robaxin that keeps her up at night. She  is requesting to return back to [place]. She has stated she understands  that it is not covered by insurance and she will pay for it out of  pocket.  Lyrica is cost prohibitive at this time (482.00$ per month) she wishes to go back on gabapentin. Continue to  monitor for any kind of problems with increased edema in the lower  extremities. We will also give her a prescription for Soma 350 mg 1  p.o. at bedtime p.r.n. muscle spasms. She is currently comfortable with  this plan. Her last MRI of her cervical spine was on July 29, 2008  showing spondylosis without significant stenosis. We will continue to  monitor her pain medication usage. She takes her medications as prescribed.  Lyrica is discontinued and gabapentin ordered.

## 2011-09-19 NOTE — Patient Instructions (Signed)
F/u one month with [employer]

## 2011-10-19 ENCOUNTER — Encounter
Payer: Medicare Other | Attending: Physical Medicine and Rehabilitation | Admitting: Physical Medicine and Rehabilitation

## 2011-10-19 ENCOUNTER — Encounter: Payer: Self-pay | Admitting: Physical Medicine and Rehabilitation

## 2011-10-19 VITALS — BP 141/38 | HR 68 | Resp 16 | Ht 64.0 in | Wt 153.0 lb

## 2011-10-19 DIAGNOSIS — G8929 Other chronic pain: Secondary | ICD-10-CM

## 2011-10-19 DIAGNOSIS — Z794 Long term (current) use of insulin: Secondary | ICD-10-CM | POA: Insufficient documentation

## 2011-10-19 DIAGNOSIS — M542 Cervicalgia: Secondary | ICD-10-CM | POA: Insufficient documentation

## 2011-10-19 DIAGNOSIS — M47812 Spondylosis without myelopathy or radiculopathy, cervical region: Secondary | ICD-10-CM | POA: Insufficient documentation

## 2011-10-19 DIAGNOSIS — E7800 Pure hypercholesterolemia, unspecified: Secondary | ICD-10-CM | POA: Insufficient documentation

## 2011-10-19 DIAGNOSIS — F17200 Nicotine dependence, unspecified, uncomplicated: Secondary | ICD-10-CM | POA: Insufficient documentation

## 2011-10-19 DIAGNOSIS — M4120 Other idiopathic scoliosis, site unspecified: Secondary | ICD-10-CM | POA: Insufficient documentation

## 2011-10-19 DIAGNOSIS — E1149 Type 2 diabetes mellitus with other diabetic neurological complication: Secondary | ICD-10-CM | POA: Insufficient documentation

## 2011-10-19 DIAGNOSIS — M25519 Pain in unspecified shoulder: Secondary | ICD-10-CM

## 2011-10-19 DIAGNOSIS — I10 Essential (primary) hypertension: Secondary | ICD-10-CM | POA: Insufficient documentation

## 2011-10-19 DIAGNOSIS — R279 Unspecified lack of coordination: Secondary | ICD-10-CM | POA: Insufficient documentation

## 2011-10-19 DIAGNOSIS — E1142 Type 2 diabetes mellitus with diabetic polyneuropathy: Secondary | ICD-10-CM | POA: Insufficient documentation

## 2011-10-19 DIAGNOSIS — M47814 Spondylosis without myelopathy or radiculopathy, thoracic region: Secondary | ICD-10-CM | POA: Insufficient documentation

## 2011-10-19 MED ORDER — GABAPENTIN 400 MG PO CAPS: 400.0000 mg | ORAL_CAPSULE | Freq: Four times a day (QID) | ORAL | Status: DC

## 2011-10-19 NOTE — Progress Notes (Signed)
Subjective:    Patient ID: Katherine Bullock, female    DOB: 07-23-1945, 66 y.o.   MRN: [id number]  HPI  Katherine Bullock is a pleasant 66 year old  married woman who is followed here at [facility] for chronic pain complaints related to her right parascapular  pain. She has known cervical as well as thoracic spondylosis.  She is back in today. She has trialed Robaxin. She tells me it keeps  her up at night, and she has stopped using it. She does report that the  Lyrica, which was 25 mg 3 times a day and increased to 50 mg twice a  day, has been somewhat beneficial.  Average pain is about 8 on a scale of 10. Pain is constant, again  located around her right scapular area, nonradiating into the  extremities, nonradiating into the neck or head.  Pain is described as sharp and stabbing at times. Sleep is poor. Pain  is worse with walking, sitting, standing, and improves with medication.  She reports a little relief with current meds.       Pain Inventory Average Pain 9 Pain Right Now 9 My pain is constant, sharp and stabbing  In the last 24 hours, has pain interfered with the following? General activity 8 Relation with others 8 Enjoyment of life 8 What TIME of day is your pain at its worst? all day Sleep (in general) Poor  Pain is worse with: walking, sitting and standing Pain improves with: medication Relief from Meds: 2  Mobility use a cane how many minutes can you walk? 10 ability to climb steps?  no do you drive?  yes Do you have any goals in this area?  no  Function disabled: date disabled  I need assistance with the following:  meal prep and household duties Do you have any goals in this area?  no  Neuro/Psych depression  Prior Studies Any changes since last visit?  no  Physicians involved in your care Primary care Abner Greenspan   Family History  Problem Relation Age of Onset  . Diabetes Mother   . Heart disease Mother   .  Heart disease Father    History   Social History  . Marital Status: Married    Spouse Name: N/A    Number of Children: N/A  . Years of Education: N/A   Social History Main Topics  . Smoking status: Current Everyday Smoker  . Smokeless tobacco: Never Used  . Alcohol Use: No  . Drug Use: None  . Sexually Active: None   Other Topics Concern  . None   Social History Narrative  . None   Past Surgical History  Procedure Date  . Tubal ligation   . Abdominal hysterectomy   . Cholecystectomy   . Mandible surgery   . Appendectomy   . Bladder surgery    Past Medical History  Diagnosis Date  . Diabetes mellitus   . Hypertension   . Hyperlipidemia   . Arthritis   . Cataract    BP 141/38  Pulse 68  Resp 16  Ht 5\' 4"  (1.626 m)  Wt 153 lb (69.4 kg)  BMI 26.26 kg/m2  SpO2 94%      Review of Systems  Constitutional: Positive for diaphoresis.  Musculoskeletal: Positive for joint swelling.  Psychiatric/Behavioral: Positive for dysphoric mood.  All other systems reviewed and are negative.       Objective:   Physical Exam  Well-developed, well-nourished woman  who appears her stated  age. Does not appear in any distress. She is oriented x3.  NEUROLOGIC: Speech is clear. Affect is bright. She is alert,  cooperative, and pleasant. Follows commands without difficulty.  Answers questions appropriately.  Cranial nerves and coordination are intact.  Reflexes are slightly brisk in the upper and lower extremities without abnormal tone, clonus, or  Tremors.  Motor strength is good in both upper and lower extremities. Slightly decreased strength 4+ tib anterior on right and 4+ hip flexor.  No focal deficits appreciated. No sensory deficits noted.  Transitions easily from sitting to standing. Gait is non-antalgic.  Tandem gait and Romberg tests are performed without difficulty.  Some mild limitations are noted in cervical range of motion as well as  lumbar motion.    Relatively well-preserved shoulder motion is appreciated.        Assessment & Plan:  1. Cervical spondylosis.  2. Mild thoracic scoliosis/spondylosis.  3. Myofascial component, periscapular pain.  4. History of diabetic peripheral neuropathy, which is stable.  5. Mild balance disorder.  Other problems include diabetes type 2, on insulin,  hypercholesterolemia, hypertension, nicotine addiction, small vessel  ischemic changes per brain MRI 2008.  PLAN: She has found that the Flexeril has not been very helpful for her  muscle spasms. She has tried Robaxin that keeps her up at night. She  is requesting to return back to [place]. She has stated she understands  that it is not covered by insurance and she will pay for it out of  pocket.  Lyrica is cost prohibitive at this time (482.00$ per month) she wishes to go back on gabapentin. Continue to  monitor for any kind of problems with increased edema in the lower  extremities. We will also give her a prescription for Soma 350 mg 1  p.o. at bedtime p.r.n. muscle spasms. She is currently comfortable with  this plan. Her last MRI of her cervical spine was on July 29, 2008  showing spondylosis without significant stenosis. We will continue to  monitor her pain medication usage. She takes her medications as prescribed.    She did better on a higher dose of gabapentin 2 years ago prior to her going on Lyrica. I will increase her dose of gabapentin from 300 mg 3 times a day to 400 mg 4 times a day.

## 2011-10-19 NOTE — Patient Instructions (Signed)
I have increased your gabapentin from 300 mg 3 times a day to 400 mg 4 times a day.  Let me know if you have any problems with your increased dosage  Comments side effects are drowsiness and dizziness.  Please do not operate machinery or drive until you know how you feel on this medication.

## 2011-12-15 ENCOUNTER — Other Ambulatory Visit: Payer: Self-pay | Admitting: Physical Medicine and Rehabilitation

## 2012-01-16 ENCOUNTER — Encounter
Payer: Medicare Other | Attending: Physical Medicine and Rehabilitation | Admitting: Physical Medicine and Rehabilitation

## 2012-01-16 ENCOUNTER — Encounter: Payer: Self-pay | Admitting: Physical Medicine and Rehabilitation

## 2012-01-16 VITALS — BP 164/68 | HR 64 | Resp 16 | Ht 65.0 in | Wt 145.0 lb

## 2012-01-16 DIAGNOSIS — M25519 Pain in unspecified shoulder: Secondary | ICD-10-CM

## 2012-01-16 DIAGNOSIS — M4120 Other idiopathic scoliosis, site unspecified: Secondary | ICD-10-CM | POA: Insufficient documentation

## 2012-01-16 DIAGNOSIS — Z794 Long term (current) use of insulin: Secondary | ICD-10-CM | POA: Insufficient documentation

## 2012-01-16 DIAGNOSIS — E7800 Pure hypercholesterolemia, unspecified: Secondary | ICD-10-CM | POA: Insufficient documentation

## 2012-01-16 DIAGNOSIS — E1142 Type 2 diabetes mellitus with diabetic polyneuropathy: Secondary | ICD-10-CM | POA: Insufficient documentation

## 2012-01-16 DIAGNOSIS — M542 Cervicalgia: Secondary | ICD-10-CM | POA: Insufficient documentation

## 2012-01-16 DIAGNOSIS — G8929 Other chronic pain: Secondary | ICD-10-CM

## 2012-01-16 DIAGNOSIS — M47812 Spondylosis without myelopathy or radiculopathy, cervical region: Secondary | ICD-10-CM | POA: Insufficient documentation

## 2012-01-16 DIAGNOSIS — F17200 Nicotine dependence, unspecified, uncomplicated: Secondary | ICD-10-CM | POA: Insufficient documentation

## 2012-01-16 DIAGNOSIS — R279 Unspecified lack of coordination: Secondary | ICD-10-CM | POA: Insufficient documentation

## 2012-01-16 DIAGNOSIS — I10 Essential (primary) hypertension: Secondary | ICD-10-CM | POA: Insufficient documentation

## 2012-01-16 DIAGNOSIS — M47814 Spondylosis without myelopathy or radiculopathy, thoracic region: Secondary | ICD-10-CM | POA: Insufficient documentation

## 2012-01-16 DIAGNOSIS — E1149 Type 2 diabetes mellitus with other diabetic neurological complication: Secondary | ICD-10-CM | POA: Insufficient documentation

## 2012-01-16 MED ORDER — CARISOPRODOL 350 MG PO TABS: 350.0000 mg | ORAL_TABLET | Freq: Every evening | ORAL | Status: DC | PRN

## 2012-01-16 MED ORDER — TRAMADOL-ACETAMINOPHEN 37.5-325 MG PO TABS: 1.0000 | ORAL_TABLET | Freq: Four times a day (QID) | ORAL | Status: AC | PRN

## 2012-01-16 NOTE — Patient Instructions (Addendum)
I have started you on a new medicine called ultracet. Please do not drive or operate machinery while taking this medicine.  If you have any side effects that worry you you may discontinue the medication and m office know. We'll discuss options at our next visit.   I will have the nurse practitioner see you next month to refill this medicine if you are doing well on it.

## 2012-01-16 NOTE — Progress Notes (Signed)
Subjective:    Patient ID: Katherine Bullock, female    DOB: 1946-03-31, 66 y.o.   MRN: [id number]  HPI   [name] is a pleasant 66 year old  married woman who is followed here at [facility] for chronic pain complaints related to her right parascapular  pain. She has known cervical as well as thoracic spondylosis.  She is back in today. She has trialed Robaxin. She tells me it keeps  her up at night, and she has stopped using it.    Average pain is about 7 on a scale of 10. Pain is constant, again  located around her right scapular area, nonradiating into the  extremities, nonradiating into the neck or head.   Pain is described as sharp and stabbing at times. Sleep is poor. Pain  is worse with walking, sitting, standing, and improves with medication.   She reports a little relief with current meds.   Insists on "something else" for periscapular pain  In past has been on oxycodone, methadone, and hydrocodone.    Will try ultracet.  Patient has not trialed this before.  We also discussed smoking cessation.  Pt has no interest in stopping smoking. Sedentary lifestyle also noted.         Pain Inventory Average Pain 7 Pain Right Now 7 My pain is sharp, stabbing and aching  In the last 24 hours, has pain interfered with the following? General activity 8 Relation with others 8 Enjoyment of life 8 What TIME of day is your pain at its worst? All Day Sleep (in general) Poor  Pain is worse with: walking, sitting and standing Pain improves with: medication Relief from Meds: 3  Mobility use a cane ability to climb steps?  no do you drive?  yes  Function disabled: date disabled   Neuro/Psych trouble walking spasms  Prior Studies Any changes since last visit?  no  Physicians involved in your care Any changes since last visit?  no   Family History  Problem Relation Age of Onset  . Diabetes Mother   . Heart disease Mother     . Heart disease Father    History   Social History  . Marital Status: Married    Spouse Name: N/A    Number of Children: N/A  . Years of Education: N/A   Social History Main Topics  . Smoking status: Current Everyday Smoker  . Smokeless tobacco: Never Used  . Alcohol Use: No  . Drug Use: None  . Sexually Active: None   Other Topics Concern  . None   Social History Narrative  . None   Past Surgical History  Procedure Date  . Tubal ligation   . Abdominal hysterectomy   . Cholecystectomy   . Mandible surgery   . Appendectomy   . Bladder surgery    Past Medical History  Diagnosis Date  . Diabetes mellitus   . Hypertension   . Hyperlipidemia   . Arthritis   . Cataract    BP 164/68  Pulse 64  Resp 16  Ht 5\' 5"  (1.651 m)  Wt 145 lb (65.772 kg)  BMI 24.13 kg/m2  SpO2 98%      Review of Systems  Constitutional: Positive for diaphoresis.  HENT: Positive for neck pain and neck stiffness.   Eyes: Negative.   Respiratory: Negative.   Cardiovascular: Positive for leg swelling.  Gastrointestinal: Negative.   Genitourinary: Negative.   Musculoskeletal: Positive for gait problem.  Skin: Negative.   Neurological: Negative.   Hematological: Negative.   Psychiatric/Behavioral: Negative.        Objective:   Physical Exam Well-developed, well-nourished woman who appears her stated   age. Does not appear in any distress. She is oriented x3.   NEUROLOGIC: Speech is clear. Affect is bright. She is alert,   cooperative, and pleasant. Follows commands without difficulty.   Answers questions appropriately.     Cranial nerves and coordination are intact.     Reflexes are slightly brisk in the upper and lower extremities without abnormal tone, clonus, or   tremors.     Motor strength is good in both upper and lower extremities. Slightly decreased strength 4+ tib anterior on right and 4+ hip flexor.     No focal deficits appreciated. No sensory deficits  noted.     Transitions easily from sitting to standing. Gait is non-antalgic.    Tandem gait and Romberg tests are performed without difficulty.    Some mild limitations are noted in cervical range of motion as well as   lumbar motion.    Relatively well-preserved shoulder motion is appreciated.   Reports tenderness in periscapular musculature esp on right.            Assessment & Plan:   Assessment & Plan:   1. Cervical spondylosis.    2. Mild thoracic scoliosis/spondylosis.  T6 T7 T8 neurotomy Hansen 04/10/02 not helpful. Managed medically.  In the past she has been on oxycodone, hydrocodone, and methadone. She has trialed Lidoderm as well as TENS unit.  3. Myofascial component, periscapular pain.    4. History of diabetic peripheral neuropathy, which is stable.    5. Mild balance disorder.    Other problems include diabetes type 2, on insulin,   hypercholesterolemia, hypertension, nicotine addiction, small vessel   ischemic changes per brain MRI 2008.    PLAN: She has found that the Flexeril has not been very helpful for her   muscle spasms. She has tried Robaxin that keeps her up at night. She   is requesting to return back to [place]. She has stated she understands   that it is not covered by insurance and she will pay for it out of   pocket.     Lyrica has been noted to be cost prohibitive performance on and she subsequently has been back on gabapentin over the last couple of months. She does not need a refill today.   Continue to monitor for any kind of problems with increased edema in the lower   extremities. We will also give her a prescription for Soma 350 mg 1   p.o. at bedtime p.r.n. muscle spasms. She is currently comfortable with   this plan.   Her last MRI of her cervical spine was on July 29, 2008   showing spondylosis without significant stenosis. We will continue to   monitor her pain medication usage. She takes her medications as prescribed.      Ultracet trialed today, risks and benefits reviewed.

## 2012-02-12 ENCOUNTER — Other Ambulatory Visit: Payer: Self-pay | Admitting: Physical Medicine and Rehabilitation

## 2012-02-15 ENCOUNTER — Encounter: Payer: Self-pay | Admitting: Physical Medicine and Rehabilitation

## 2012-02-15 ENCOUNTER — Encounter
Payer: Medicare Other | Attending: Physical Medicine and Rehabilitation | Admitting: Physical Medicine and Rehabilitation

## 2012-02-15 VITALS — BP 136/58 | HR 55 | Resp 16 | Ht 64.0 in | Wt 145.0 lb

## 2012-02-15 DIAGNOSIS — Z794 Long term (current) use of insulin: Secondary | ICD-10-CM | POA: Insufficient documentation

## 2012-02-15 DIAGNOSIS — M47812 Spondylosis without myelopathy or radiculopathy, cervical region: Secondary | ICD-10-CM | POA: Insufficient documentation

## 2012-02-15 DIAGNOSIS — G8929 Other chronic pain: Secondary | ICD-10-CM | POA: Insufficient documentation

## 2012-02-15 DIAGNOSIS — E1142 Type 2 diabetes mellitus with diabetic polyneuropathy: Secondary | ICD-10-CM | POA: Insufficient documentation

## 2012-02-15 DIAGNOSIS — E785 Hyperlipidemia, unspecified: Secondary | ICD-10-CM | POA: Insufficient documentation

## 2012-02-15 DIAGNOSIS — I10 Essential (primary) hypertension: Secondary | ICD-10-CM | POA: Insufficient documentation

## 2012-02-15 DIAGNOSIS — F17200 Nicotine dependence, unspecified, uncomplicated: Secondary | ICD-10-CM | POA: Insufficient documentation

## 2012-02-15 DIAGNOSIS — E7800 Pure hypercholesterolemia, unspecified: Secondary | ICD-10-CM | POA: Insufficient documentation

## 2012-02-15 DIAGNOSIS — R279 Unspecified lack of coordination: Secondary | ICD-10-CM | POA: Insufficient documentation

## 2012-02-15 DIAGNOSIS — E1149 Type 2 diabetes mellitus with other diabetic neurological complication: Secondary | ICD-10-CM | POA: Insufficient documentation

## 2012-02-15 DIAGNOSIS — M25519 Pain in unspecified shoulder: Secondary | ICD-10-CM | POA: Insufficient documentation

## 2012-02-15 DIAGNOSIS — M4120 Other idiopathic scoliosis, site unspecified: Secondary | ICD-10-CM | POA: Insufficient documentation

## 2012-02-15 MED ORDER — TRAMADOL-ACETAMINOPHEN 37.5-325 MG PO TABS: 1.0000 | ORAL_TABLET | Freq: Four times a day (QID) | ORAL | Status: DC

## 2012-02-15 NOTE — Patient Instructions (Signed)
Continue with doing some exercises and stretches you learned from PT, continue applying heat and ice for pain relief.

## 2012-02-15 NOTE — Progress Notes (Signed)
Subjective:    Patient ID: Katherine Bullock, female    DOB: [birth year], 66 y.o.   MRN: [id number]  HPI The patient complains about chronic pain between her shoulder blades, worse on the right, which radiates into the right flank. This problem has started around 12 years ago, PT, nerve ablation and several other treatments were not successful. The patient denies any numbness and tingling. The problem has improved some, since she is on the Tramadol and Soma.   Pain Inventory Average Pain 8 Pain Right Now 7 My pain is constant, sharp and stabbing  In the last 24 hours, has pain interfered with the following? General activity 7 Relation with others 7 Enjoyment of life 7 What TIME of day is your pain at its worst? all day and night Sleep (in general) Poor  Pain is worse with: walking, sitting and standing Pain improves with: medication Relief from Meds: 3  Mobility use a cane how many minutes can you walk? 10 ability to climb steps?  no do you drive?  yes Do you have any goals in this area?  no  Function disabled: date disabled   Neuro/Psych spasms depression  Prior Studies Any changes since last visit?  no  Physicians involved in your care Any changes since last visit?  no   Family History  Problem Relation Age of Onset  . Diabetes Mother   . Heart disease Mother   . Heart disease Father    History   Social History  . Marital Status: Married    Spouse Name: N/A    Number of Children: N/A  . Years of Education: N/A   Social History Main Topics  . Smoking status: Current Every Day Smoker  . Smokeless tobacco: Never Used  . Alcohol Use: No  . Drug Use: None  . Sexually Active: None   Other Topics Concern  . None   Social History Narrative  . None   Past Surgical History  Procedure Date  . Tubal ligation   . Abdominal hysterectomy   . Cholecystectomy   . Mandible surgery   . Appendectomy   . Bladder surgery    Past Medical History  Diagnosis Date    . Diabetes mellitus   . Hypertension   . Hyperlipidemia   . Arthritis   . Cataract    BP 136/58  Pulse 55  Resp 16  Ht 5\' 4"  (1.626 m)  Wt 145 lb (65.772 kg)  BMI 24.89 kg/m2  SpO2 97%     Review of Systems  Constitutional: Positive for diaphoresis.  Musculoskeletal: Positive for myalgias, back pain, joint swelling and arthralgias.  Psychiatric/Behavioral: Positive for dysphoric mood.  All other systems reviewed and are negative.       Objective:   Physical Exam  Constitutional: She is oriented to person, place, and time. She appears well-developed and well-nourished.  HENT:  Head: Normocephalic.  Musculoskeletal: She exhibits tenderness.  Neurological: She is alert and oriented to person, place, and time.  Skin: Skin is warm and dry.  Psychiatric: She has a normal mood and affect.    Symmetric normal motor tone is noted throughout, except increased muscle tone of trapezius descendens . Normal muscle bulk. Muscle testing reveals 5/5 muscle strength of the upper extremity, and 5/5 of the lower extremity. Full range of motion in upper and lower extremities. ROM of spine is  restricted. Thoracic skoliosis convex to the right. Sensory is intact and symmetric to light touch, pinprick and proprioception. DTR in  the upper and lower extremity are present and symmetric 2+. No clonus is noted.  Patient arises from chair without difficulty. Narrow based gait with normal arm swing bilateral , able to walk on heels and toes . Tandem walk is stable. No pronator drift. Rhomberg negative.        Assessment & Plan:  1. Cervical spondylosis.  2. Mild thoracic scoliosis/ right convex . T6 T7 T8 neurotomy Hansen 04/10/02 not helpful. Managed medically. In the past she has been on oxycodone, hydrocodone, and methadone. She has trialed Lidoderm as well as TENS unit, and PT without sucess.  3. Myofascial component, periscapular pain.  4. History of diabetic peripheral neuropathy, which  is stable.  5. Mild balance disorder.  Other problems include diabetes type 2, on insulin,  hypercholesterolemia, hypertension, nicotine addiction, small vessel  ischemic changes per brain MRI 2008.  PLAN: She has found that the Flexeril has not been very helpful for her  muscle spasms. She has tried Robaxin that keeps her up at night. She  is requesting to return back to [place]. She has stated she understands  that it is not covered by insurance and she will pay for it . Showed patient some exercises to release the muscle tension, and to improve her posture some. Also suggested that her husband massages the tens muscles with a tennisball to give her relief. She states, that the Tramadol gives her some relief, refilled her tramadol today. She has a follow up appointment with Dr. Pamelia Hoit next month.

## 2012-03-14 ENCOUNTER — Encounter: Payer: Medicare Other | Admitting: Physical Medicine and Rehabilitation

## 2012-05-28 ENCOUNTER — Other Ambulatory Visit: Payer: Self-pay | Admitting: Physical Medicine and Rehabilitation

## 2012-07-03 ENCOUNTER — Other Ambulatory Visit: Payer: Self-pay | Admitting: Physical Medicine and Rehabilitation

## 2012-07-03 NOTE — Telephone Encounter (Signed)
Soma called into [facility].

## 2012-07-10 ENCOUNTER — Other Ambulatory Visit: Payer: Self-pay | Admitting: Physical Medicine & Rehabilitation

## 2012-08-06 ENCOUNTER — Other Ambulatory Visit: Payer: Self-pay | Admitting: Physical Medicine and Rehabilitation

## 2012-08-07 NOTE — Telephone Encounter (Signed)
Soma called into [facility]

## 2013-11-15 ENCOUNTER — Encounter: Payer: Self-pay | Admitting: Vascular Surgery

## 2013-11-15 ENCOUNTER — Other Ambulatory Visit: Payer: Self-pay

## 2013-11-15 DIAGNOSIS — I6529 Occlusion and stenosis of unspecified carotid artery: Secondary | ICD-10-CM

## 2013-12-05 ENCOUNTER — Encounter: Payer: Self-pay | Admitting: Vascular Surgery

## 2013-12-06 ENCOUNTER — Ambulatory Visit (HOSPITAL_COMMUNITY)
Admission: RE | Admit: 2013-12-06 | Discharge: 2013-12-06 | Disposition: A | Discharge status: Home / Self Care | Payer: Medicare Other | Source: Ambulatory Visit | Attending: Vascular Surgery | Admitting: Vascular Surgery

## 2013-12-06 ENCOUNTER — Encounter: Payer: Self-pay | Admitting: Vascular Surgery

## 2013-12-06 ENCOUNTER — Ambulatory Visit (INDEPENDENT_AMBULATORY_CARE_PROVIDER_SITE_OTHER): Payer: Medicare Other | Admitting: Vascular Surgery

## 2013-12-06 VITALS — BP 128/40 | HR 60 | Ht 64.0 in | Wt 146.8 lb

## 2013-12-06 DIAGNOSIS — I6529 Occlusion and stenosis of unspecified carotid artery: Secondary | ICD-10-CM | POA: Diagnosis present

## 2013-12-06 DIAGNOSIS — I639 Cerebral infarction, unspecified: Secondary | ICD-10-CM | POA: Insufficient documentation

## 2013-12-06 DIAGNOSIS — I6350 Cerebral infarction due to unspecified occlusion or stenosis of unspecified cerebral artery: Secondary | ICD-10-CM

## 2013-12-06 NOTE — Progress Notes (Signed)
New Carotid Patient  Referred by:  Marco Collie, MD [place], [place] [address]  Reason for referral: Recent stroke  History of Present Illness  Katherine Bullock is a 68 y.o. (01/02/1946) female who presents with chief complaint: recent stroke.  Pt was seen at [facility] 10/12/13 with stroke sx.  The patient has never had amaurosis fugax or monocular blindness.  The patient has R side extremity weakness.  The patient has also had expressive aphasia.   The patient's previous neurologic deficits have resolved.  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  The patient's risks factors for carotid disease include: DM, HTN, HLD, and active smoking.  Past Medical History  Diagnosis Date  . Diabetes mellitus   . Hypertension   . Hyperlipidemia   . Arthritis   . Cataract   . Stroke     Past Surgical History  Procedure Laterality Date  . Tubal ligation    . Abdominal hysterectomy    . Cholecystectomy    . Mandible surgery    . Appendectomy    . Bladder surgery    . Cataract extraction Bilateral     History   Social History  . Marital Status: Married    Spouse Name: N/A    Number of Children: N/A  . Years of Education: N/A   Occupational History  . Not on file.   Social History Main Topics  . Smoking status: Current Every Day Smoker -- 1.00 packs/day    Types: Cigarettes  . Smokeless tobacco: Never Used  . Alcohol Use: No  . Drug Use: No  . Sexual Activity: Not on file   Other Topics Concern  . Not on file   Social History Narrative  . No narrative on file    Family History  Problem Relation Age of Onset  . Diabetes Mother   . Heart disease Mother   . Cancer Mother   . Hyperlipidemia Mother   . Hypertension Mother   . Heart attack Mother   . Heart disease Father   . Heart attack Father   . Hypertension Son    Current Outpatient Prescriptions on File Prior to Visit  Medication Sig Dispense Refill  . amLODipine  (NORVASC) 5 MG tablet Take 5 mg by mouth daily.      Marland Kitchen aspirin 81 MG tablet Take 81 mg by mouth daily.      Marland Kitchen atorvastatin (LIPITOR) 40 MG tablet Take 40 mg by mouth daily.      . carisoprodol (SOMA) 350 MG tablet TAKE ONE TABLET BY MOUTH AT BEDTIME AS NEEDED FOR MUSCLE SPASMS MUST LAST 30 DAYS  30 tablet  2  . fenofibrate 160 MG tablet Take 160 mg by mouth daily.      Marland Kitchen gabapentin (NEURONTIN) 400 MG capsule TAKE ONE CAPSULE BY MOUTH 4 TIMES DAILY  120 capsule  0  . hydrALAZINE (APRESOLINE) 25 MG tablet Take 25 mg by mouth 3 (three) times daily.      . insulin detemir (LEVEMIR) 100 UNIT/ML injection Inject 55 Units into the skin at bedtime.      Marland Kitchen lisinopril (PRINIVIL,ZESTRIL) 20 MG tablet Take 20 mg by mouth 2 (two) times daily.      . metFORMIN (GLUCOPHAGE) 1000 MG tablet Take 1,000 mg by mouth 2 (two) times daily.      . nebivolol (BYSTOLIC) 10 MG tablet Take 20 mg by mouth daily.      Marland Kitchen  spironolactone (ALDACTONE) 25 MG tablet Take 25 mg by mouth daily.      . traMADol-acetaminophen (ULTRACET) 37.5-325 MG per tablet Take 1 tablet by mouth 4 (four) times daily.  120 tablet  2   No current facility-administered medications on file prior to visit.    Allergies  Allergen Reactions  . Amitriptyline Itching  . Flexeril [Cyclobenzaprine Hcl] Itching    REVIEW OF SYSTEMS:  (Positives checked otherwise negative)  CARDIOVASCULAR:  []  chest pain, []  chest pressure, []  palpitations, []  shortness of breath when laying flat, []  shortness of breath with exertion,  []  pain in feet when walking, []  pain in feet when laying flat, []  history of blood clot in veins (DVT), []  history of phlebitis, []  swelling in legs, []  varicose veins  PULMONARY:  []  productive cough, []  asthma, []  wheezing  NEUROLOGIC:  []  weakness in arms or legs, []  numbness in arms or legs, []  difficulty speaking or slurred speech, []  temporary loss of vision in one eye, []  dizziness  HEMATOLOGIC:  []  bleeding problems, []  problems  with blood clotting too easily  MUSCULOSKEL:  []  joint pain, []  joint swelling  GASTROINTEST:  []  vomiting blood, []  blood in stool     GENITOURINARY:  []  burning with urination, []  blood in urine  PSYCHIATRIC:  []  history of major depression  INTEGUMENTARY:  []  rashes, []  ulcers  CONSTITUTIONAL:  []  fever, []  chills  For VQI Use Only  PRE-ADM LIVING: Home  AMB STATUS: Ambulatory  CAD Sx: None  PRIOR CHF: None  STRESS TEST: [x]  No, [ ]  Normal, [ ]  + ischemia, [ ]  + MI, [ ]  Both  Physical Examination  Filed Vitals:   12/06/13 0951 12/06/13 0954  BP: 139/40 128/40  Pulse: 60   Height: 5\' 4"  (1.626 m)   Weight: 146 lb 12.8 oz (66.588 kg)   SpO2: 99%    Body mass index is 25.19 kg/(m^2).  General: A&O x 3, WDWN  Head: [place]/AT  Ear/Nose/Throat: Hearing grossly intact, nares w/o erythema or drainage, oropharynx w/o Erythema/Exudate, Mallampati score: 3  Eyes: PERRLA, EOMI, Post surg chg to lens  Neck: Supple, no nuchal rigidity, no palpable LAD  Pulmonary: Sym exp, good air movt, CTAB, no rales, rhonchi, & wheezing  Cardiac: RRR, Nl S1, S2, no Murmurs, rubs or gallops  Vascular: Vessel Right Left  Radial Palpable Palpable  Brachial Palpable Palpable  Carotid Palpable, without bruit Palpable, without bruit  Aorta  Not palpable N/A  Femoral Palpable Palpable  Popliteal Not palpable Not palpable  PT Not Palpable Not Palpable  DP Palpable Palpable   Gastrointestinal: soft, NTND, -G/R, - HSM, - masses, - CVAT B  Musculoskeletal: M/S 5/5 throughout , Extremities without ischemic changes   Neurologic: CN 2-12 intact , Pain and light touch intact in extremities , Motor exam as listed above  Psychiatric: Judgment intact, Mood & affect appropriate for pt's clinical situation  Dermatologic: See M/S exam for extremity exam, no rashes otherwise noted  Lymph : No Cervical, Axillary, or Inguinal lymphadenopathy   Non-Invasive Vascular Imaging  CAROTID DUPLEX  (Date: 12/06/2013):   R ICA stenosis: <40%  R VA: patent and antegrade  L ICA stenosis: <40%  L VA:  patent and antegrade  Outside Studies/Documentation 10 pages of outside documents were reviewed including: outside ED evaluation and radiology reports.  Medical Decision Making  Katherine Bullock is a 68 y.o. female who presents with: B asx ICA stenosis <40%.   Given the bilaterality of  the cerebellar CVA, I doubt her limited carotid disease is responsible for her CVA.  <20% of CVA are due to carotid disease.  In reviewing the outside carotid duplex, there inconsistencies in the L carotid scan which should have reduced the read to <50% on that side.  Based on the patient's vascular studies and examination, I have offered the patient: annual B carotid duplex.  I discussed in depth with the patient the nature of atherosclerosis, and emphasized the importance of maximal medical management including strict control of blood pressure, blood glucose, and lipid levels, obtaining regular exercise, antiplatelet agents, and cessation of smoking.   The patient is currently on a statin: Lipitor.  The patient is currently on an anti-platelet: ASA.  The patient is aware that without maximal medical management the underlying atherosclerotic disease process will progress, limiting the benefit of any interventions.  Thank you for allowing [place] to participate in this patient's care.  Adele Barthel, MD [facility] Office: [phone] Pager: [phone]  12/06/2013, 10:25 AM

## 2014-01-10 ENCOUNTER — Encounter: Payer: Self-pay | Admitting: Vascular Surgery

## 2014-12-12 ENCOUNTER — Ambulatory Visit: Payer: Medicare Other | Admitting: Family

## 2014-12-12 ENCOUNTER — Other Ambulatory Visit (HOSPITAL_COMMUNITY): Payer: Medicare Other

## 2015-10-23 ENCOUNTER — Other Ambulatory Visit (HOSPITAL_COMMUNITY): Payer: Self-pay | Admitting: Family Medicine

## 2015-10-23 DIAGNOSIS — R918 Other nonspecific abnormal finding of lung field: Secondary | ICD-10-CM

## 2015-10-27 ENCOUNTER — Ambulatory Visit (INDEPENDENT_AMBULATORY_CARE_PROVIDER_SITE_OTHER): Payer: PPO | Admitting: Pulmonary Disease

## 2015-10-27 ENCOUNTER — Other Ambulatory Visit (INDEPENDENT_AMBULATORY_CARE_PROVIDER_SITE_OTHER): Payer: PPO

## 2015-10-27 ENCOUNTER — Encounter: Payer: Self-pay | Admitting: Pulmonary Disease

## 2015-10-27 VITALS — BP 126/78 | HR 72 | Ht 64.0 in | Wt 134.8 lb

## 2015-10-27 DIAGNOSIS — J439 Emphysema, unspecified: Secondary | ICD-10-CM

## 2015-10-27 DIAGNOSIS — R918 Other nonspecific abnormal finding of lung field: Secondary | ICD-10-CM

## 2015-10-27 DIAGNOSIS — IMO0002 Reserved for concepts with insufficient information to code with codable children: Secondary | ICD-10-CM

## 2015-10-27 DIAGNOSIS — F17200 Nicotine dependence, unspecified, uncomplicated: Secondary | ICD-10-CM

## 2015-10-27 LAB — BASIC METABOLIC PANEL
BUN: 6 mg/dL (ref 6–23)
CHLORIDE: 103 meq/L (ref 96–112)
CO2: 29 meq/L (ref 19–32)
Calcium: 10.2 mg/dL (ref 8.4–10.5)
Creatinine, Ser: 0.77 mg/dL (ref 0.40–1.20)
GFR: 78.59 mL/min (ref >60.00)
GLUCOSE: 130 mg/dL — AB (ref 70–99)
Potassium: 4.5 mEq/L (ref 3.5–5.1)
SODIUM: 139 meq/L (ref 135–145)

## 2015-10-27 LAB — CBC WITH DIFFERENTIAL/PLATELET
BASOS ABS: 0.1 10*3/uL (ref 0.0–0.1)
Basophils Relative: 0.5 % (ref 0.0–3.0)
EOS PCT: 4.8 % (ref 0.0–5.0)
Eosinophils Absolute: 0.6 10*3/uL (ref 0.0–0.7)
HCT: 42.8 % (ref 36.0–46.0)
HEMOGLOBIN: 14.6 g/dL (ref 12.0–15.0)
Lymphocytes Relative: 33 % (ref 12.0–46.0)
Lymphs Abs: 4.3 10*3/uL — ABNORMAL HIGH (ref 0.7–4.0)
MCHC: 34 g/dL (ref 30.0–36.0)
MCV: 92 fl (ref 78.0–100.0)
MONO ABS: 0.9 10*3/uL (ref 0.1–1.0)
MONOS PCT: 6.6 % (ref 3.0–12.0)
NEUTROS PCT: 55.1 % (ref 43.0–77.0)
Neutro Abs: 7.2 10*3/uL (ref 1.4–7.7)
Platelets: 411 10*3/uL — ABNORMAL HIGH (ref 150.0–400.0)
RBC: 4.65 Mil/uL (ref 3.87–5.11)
RDW: 13 % (ref 11.5–15.5)
WBC: 13 10*3/uL — AB (ref 4.0–10.5)

## 2015-10-27 NOTE — Progress Notes (Signed)
Subjective:    Patient ID: Katherine Bullock, female    DOB: 1945-12-23, 70 y.o.   MRN: [id number]  HPI Patient reports she developed a cough productive of a yellow mucus for 1 week prior to presenting to her PCP. She also had a "soreness" in her chest radiating to her right axilla and back. Denies any hemoptysis. No fever, chills, or sweats. She reports her baseline dyspnea, nothing new. No dysphagia, odynophagia, nausea, or emesis. No abdominal pain. No melena or hematochezia. No headaches. No focal weakness, numbness, or tingling. No adenopathy in her neck, groin, or axilla. No vaginal discharge. Patient had colonoscopy approximately 1 year ago. She is scheduled for a PET CT on Friday.   Review of Systems No rashes or bruising. No hematuria or dysuria. A pertinent 14 point review of systems is negative except as per the history of presenting illness.  Allergies  Allergen Reactions  . Amitriptyline Itching  . Flexeril [Cyclobenzaprine Hcl] Itching    Current Outpatient Prescriptions on File Prior to Visit  Medication Sig Dispense Refill  . amLODipine (NORVASC) 5 MG tablet Take 5 mg by mouth daily.    Marland Kitchen aspirin 81 MG tablet Take 81 mg by mouth daily.    Marland Kitchen atorvastatin (LIPITOR) 40 MG tablet Take 40 mg by mouth daily.    . fenofibrate 160 MG tablet Take 160 mg by mouth daily.    . hydrALAZINE (APRESOLINE) 25 MG tablet Take 25 mg by mouth 3 (three) times daily.    Marland Kitchen lisinopril (PRINIVIL,ZESTRIL) 20 MG tablet Take 20 mg by mouth 2 (two) times daily.    . metFORMIN (GLUCOPHAGE) 1000 MG tablet Take 1,000 mg by mouth 2 (two) times daily.    Marland Kitchen spironolactone (ALDACTONE) 25 MG tablet Take 25 mg by mouth daily.     No current facility-administered medications on file prior to visit.    Past Medical History  Diagnosis Date  . Diabetes mellitus   . Hypertension   . Hyperlipidemia   . Arthritis   . Cataract   . Stroke ([place])   . Cholelithiases     Past Surgical History  Procedure Laterality  Date  . Tubal ligation    . Abdominal hysterectomy    . Cholecystectomy    . Mandible surgery    . Appendectomy    . Bladder surgery    . Cataract extraction Bilateral     Family History  Problem Relation Age of Onset  . Diabetes Mother   . Heart disease Mother   . Breast cancer Mother   . Hyperlipidemia Mother   . Hypertension Mother   . Heart attack Mother   . Heart disease Father   . Heart attack Father   . Hypertension Son   . Breast cancer Maternal Aunt   . Lung disease Neg Hx     Social History   Social History  . Marital Status: Married    Spouse Name: N/A  . Number of Children: N/A  . Years of Education: N/A   Occupational History  . retired    Social History Main Topics  . Smoking status: Current Every Day Smoker -- 1.00 packs/day for 50 years    Types: Cigarettes    Start date: 05/09/1965  . Smokeless tobacco: Current User    Types: Snuff     Comment: Peak rate of 1ppd  . Alcohol Use: No  . Drug Use: No  . Sexual Activity: Not Asked   Other Topics Concern  .  None   Social History Narrative   Originally from [place]. Previously worked Psychologist, sport and exercise. She recently just got a dog. No bird exposure. No mold exposure.        Objective:   Physical Exam BP 126/78 mmHg  Pulse 72  Ht '5\' 4"'$  (1.626 m)  Wt 134 lb 12.8 oz (61.145 kg)  BMI 23.13 kg/m2  SpO2 97% General:  Awake. Alert. No acute distress. Daughter with her today.  Integument:  Warm & dry. No rash on exposed skin. No bruising. Lymphatics:  No appreciated cervical or supraclavicular lymphadenoapthy. HEENT:  Moist mucus membranes. No oral ulcers. No scleral injection or icterus.  Cardiovascular:  Regular rate. No edema. No appreciable JVD.  Pulmonary:  Good aeration & clear to auscultation bilaterally. Symmetric chest wall expansion. No accessory muscle use. Abdomen: Soft. Normal bowel sounds. Nondistended. Grossly nontender. Musculoskeletal:  Normal bulk and tone. Hand grip strength 5/5  bilaterally. No joint deformity or effusion appreciated. Neurological:  CN 2-12 grossly in tact. No meningismus. Moving all 4 extremities equally. Symmetric brachioradialis deep tendon reflexes. Psychiatric:  Mood and affect congruent. Speech normal rhythm, rate & tone.   IMAGING CT CHEST W/O 10/22/15 (personally reviewed by me): Large subcarinal heterogeneous mass. There is questionable a subcarinal nodule on CT imaging form 2015 by my review. No pathologic lymphadenopathy. No pleural effusion or thickening. Apical predominant emphysematous changes. No pericardial effusion.    Assessment & Plan:  70 year old female found to have subcarinal mass.Suspect underlying malignancy. I will follow up on PET/CT imaging. Plan for bronchoscopy with airway exam and into bronchial ultrasound exam with fine-needle aspiration on 6/26. I did discuss the risks of the procedure including bleeding, infection, pneumothorax, vocal cord injury, medication allergy, and potentially death. The patient is willing to undergo the procedure. I suspect her underlying emphysema secondary to her chronic tobacco use but we must screen her for alpha-1 antitrypsin deficiency. I instructed the patient contact my office if she had any new breathing problems or questions before her next appointment.  1. Subcarinal Mass:  Plan to follow-up on PET CT. Checking serum CBC & BMP. Plan for bronchoscopy with endobronchial ultrasound and biopsy on 6/26 at 7:30 AM. 2. Emphysema: Full only function testing ASAP & screening for alpha-1 antitrypsin deficiency. 3. Ongoing Tobacco Use: Patient counseled for over 3 minutes and need for complete tobacco cessation. 4. Follow-up: Patient to return to clinic in 2-3 weeks.  Sonia Baller Ashok Cordia, M.D. [facility] Pager:  [phone] After 3pm or if no response, call [phone] 12:43 PM 10/27/2015

## 2015-10-27 NOTE — Patient Instructions (Addendum)
   We will arrange for your bronchoscopy on 6/26 at 7:30 AM and [facility] in their Endoscopy Suite. Remember to avoid eating or drinking anything within 6 hours of your procedure time.  Call me if you have any questions or concerns  I will see you back in 2-3 weeks.  TESTS ORDERED: 1. Full pulmonary function testing ASAP 2. Serum CBC with differential, BMP, & alpha-1 antitrypsin phenotype

## 2015-10-30 ENCOUNTER — Ambulatory Visit (HOSPITAL_COMMUNITY)
Admission: RE | Admit: 2015-10-30 | Discharge: 2015-10-30 | Disposition: A | Discharge status: Home / Self Care | Payer: PPO | Source: Ambulatory Visit | Attending: Family Medicine | Admitting: Family Medicine

## 2015-10-30 DIAGNOSIS — F17210 Nicotine dependence, cigarettes, uncomplicated: Secondary | ICD-10-CM | POA: Insufficient documentation

## 2015-10-30 DIAGNOSIS — M1990 Unspecified osteoarthritis, unspecified site: Secondary | ICD-10-CM | POA: Insufficient documentation

## 2015-10-30 DIAGNOSIS — Z7982 Long term (current) use of aspirin: Secondary | ICD-10-CM | POA: Insufficient documentation

## 2015-10-30 DIAGNOSIS — J439 Emphysema, unspecified: Secondary | ICD-10-CM | POA: Insufficient documentation

## 2015-10-30 DIAGNOSIS — I10 Essential (primary) hypertension: Secondary | ICD-10-CM | POA: Diagnosis not present

## 2015-10-30 DIAGNOSIS — Z79899 Other long term (current) drug therapy: Secondary | ICD-10-CM | POA: Diagnosis not present

## 2015-10-30 DIAGNOSIS — Z794 Long term (current) use of insulin: Secondary | ICD-10-CM | POA: Insufficient documentation

## 2015-10-30 DIAGNOSIS — Z8673 Personal history of transient ischemic attack (TIA), and cerebral infarction without residual deficits: Secondary | ICD-10-CM | POA: Insufficient documentation

## 2015-10-30 DIAGNOSIS — E785 Hyperlipidemia, unspecified: Secondary | ICD-10-CM | POA: Diagnosis not present

## 2015-10-30 DIAGNOSIS — I898 Other specified noninfective disorders of lymphatic vessels and lymph nodes: Secondary | ICD-10-CM | POA: Insufficient documentation

## 2015-10-30 DIAGNOSIS — R918 Other nonspecific abnormal finding of lung field: Secondary | ICD-10-CM

## 2015-10-30 DIAGNOSIS — R05 Cough: Secondary | ICD-10-CM | POA: Diagnosis present

## 2015-10-30 DIAGNOSIS — E1151 Type 2 diabetes mellitus with diabetic peripheral angiopathy without gangrene: Secondary | ICD-10-CM | POA: Insufficient documentation

## 2015-10-30 LAB — GLUCOSE, CAPILLARY: GLUCOSE-CAPILLARY: 180 mg/dL — AB (ref 65–99)

## 2015-10-30 MED ORDER — FLUDEOXYGLUCOSE F - 18 (FDG) INJECTION
6.6700 | Freq: Once | INTRAVENOUS | Status: AC | PRN
  Administered 2015-10-30: 6.67 via INTRAVENOUS

## 2015-10-31 LAB — ALPHA-1 ANTITRYPSIN PHENOTYPE: A1 ANTITRYPSIN: 112 mg/dL (ref 83–199)

## 2015-11-01 NOTE — Anesthesia Preprocedure Evaluation (Addendum)
Anesthesia Evaluation  Patient identified by MRN, date of birth, ID band Patient awake    Reviewed: Allergy & Precautions, NPO status , Patient's Chart, lab work & pertinent test results  Airway Mallampati: II  TM Distance: >3 FB Neck ROM: Full    Dental  (+) Dental Advisory Given, Edentulous Upper, Edentulous Lower   Pulmonary COPD, Current Smoker,  Subcarinal mass   Pulmonary exam normal breath sounds clear to auscultation       Cardiovascular hypertension, Pt. on medications + Peripheral Vascular Disease  Normal cardiovascular exam Rhythm:Regular Rate:Normal     Neuro/Psych CVA, No Residual Symptoms negative psych ROS   GI/Hepatic negative GI ROS, Neg liver ROS,   Endo/Other  diabetes, Type 2, Oral Hypoglycemic Agents, Insulin Dependent  Renal/GU negative Renal ROS     Musculoskeletal  (+) Arthritis , Osteoarthritis,    Abdominal   Peds  Hematology negative hematology ROS (+)   Anesthesia Other Findings Day of surgery medications reviewed with the patient.  Reproductive/Obstetrics                          Anesthesia Physical Anesthesia Plan  ASA: III  Anesthesia Plan: MAC   Post-op Pain Management:    Induction: Intravenous  Airway Management Planned: Nasal Cannula  Additional Equipment:   Intra-op Plan:   Post-operative Plan:   Informed Consent: I have reviewed the patients History and Physical, chart, labs and discussed the procedure including the risks, benefits and alternatives for the proposed anesthesia with the patient or authorized representative who has indicated his/her understanding and acceptance.   Dental advisory given  Plan Discussed with: CRNA  Anesthesia Plan Comments: (Discussed risks/benefits/alternatives to MAC sedation including need for ventilatory support, hypotension, need for conversion to general anesthesia.  All patient questions answered.   Patient/guardian wishes to proceed.)       Anesthesia Quick Evaluation

## 2015-11-02 ENCOUNTER — Ambulatory Visit (HOSPITAL_COMMUNITY)
Admission: RE | Admit: 2015-11-02 | Discharge: 2015-11-02 | Disposition: A | Discharge status: Home / Self Care | Payer: PPO | Source: Ambulatory Visit | Attending: Pulmonary Disease | Admitting: Pulmonary Disease

## 2015-11-02 ENCOUNTER — Encounter (HOSPITAL_COMMUNITY)
Admission: RE | Disposition: A | Discharge status: Home / Self Care | Payer: Self-pay | Source: Ambulatory Visit | Attending: Pulmonary Disease

## 2015-11-02 ENCOUNTER — Ambulatory Visit (HOSPITAL_COMMUNITY): Payer: PPO | Admitting: Certified Registered"

## 2015-11-02 ENCOUNTER — Encounter (HOSPITAL_COMMUNITY): Payer: Self-pay | Admitting: Certified Registered"

## 2015-11-02 ENCOUNTER — Ambulatory Visit (HOSPITAL_COMMUNITY): Payer: PPO

## 2015-11-02 DIAGNOSIS — F17210 Nicotine dependence, cigarettes, uncomplicated: Secondary | ICD-10-CM | POA: Diagnosis not present

## 2015-11-02 DIAGNOSIS — Z7982 Long term (current) use of aspirin: Secondary | ICD-10-CM | POA: Diagnosis not present

## 2015-11-02 DIAGNOSIS — R918 Other nonspecific abnormal finding of lung field: Secondary | ICD-10-CM | POA: Diagnosis not present

## 2015-11-02 DIAGNOSIS — I898 Other specified noninfective disorders of lymphatic vessels and lymph nodes: Secondary | ICD-10-CM | POA: Diagnosis not present

## 2015-11-02 DIAGNOSIS — J439 Emphysema, unspecified: Secondary | ICD-10-CM | POA: Diagnosis not present

## 2015-11-02 HISTORY — PX: ENDOBRONCHIAL ULTRASOUND: SHX5096

## 2015-11-02 LAB — GLUCOSE, CAPILLARY: GLUCOSE-CAPILLARY: 165 mg/dL — AB (ref 65–99)

## 2015-11-02 SURGERY — ENDOBRONCHIAL ULTRASOUND (EBUS)
Anesthesia: Monitor Anesthesia Care | Laterality: Bilateral

## 2015-11-02 MED ORDER — PROPOFOL 10 MG/ML IV BOLUS
INTRAVENOUS | Status: AC
  Filled 2015-11-02: qty 20

## 2015-11-02 MED ORDER — REMIFENTANIL HCL 1 MG IV SOLR
INTRAVENOUS | Status: DC | PRN
  Administered 2015-11-02: .5 ug/kg/min via INTRAVENOUS

## 2015-11-02 MED ORDER — LACTATED RINGERS IV SOLN
INTRAVENOUS | Status: DC
  Administered 2015-11-02 (×2): via INTRAVENOUS

## 2015-11-02 MED ORDER — LIDOCAINE HCL (CARDIAC) 20 MG/ML IV SOLN
INTRAVENOUS | Status: DC | PRN
  Administered 2015-11-02: 100 mg via INTRAVENOUS

## 2015-11-02 MED ORDER — SUCCINYLCHOLINE CHLORIDE 20 MG/ML IJ SOLN
INTRAMUSCULAR | Status: DC | PRN
  Administered 2015-11-02 (×2): 100 mg via INTRAVENOUS

## 2015-11-02 MED ORDER — FENTANYL CITRATE (PF) 250 MCG/5ML IJ SOLN
INTRAMUSCULAR | Status: AC
  Filled 2015-11-02: qty 5

## 2015-11-02 MED ORDER — REMIFENTANIL HCL 1 MG IV SOLR
INTRAVENOUS | Status: AC
  Filled 2015-11-02: qty 1000

## 2015-11-02 MED ORDER — PROPOFOL 10 MG/ML IV BOLUS
INTRAVENOUS | Status: DC | PRN
  Administered 2015-11-02: 180 mg via INTRAVENOUS
  Administered 2015-11-02: 20 mg via INTRAVENOUS
  Administered 2015-11-02: 50 mg via INTRAVENOUS

## 2015-11-02 MED ORDER — PROPOFOL 500 MG/50ML IV EMUL
INTRAVENOUS | Status: DC | PRN
Start: 2015-11-02 — End: 2015-11-02
  Administered 2015-11-02: 75 ug/kg/min via INTRAVENOUS

## 2015-11-02 MED ORDER — ONDANSETRON HCL 4 MG/2ML IJ SOLN
INTRAMUSCULAR | Status: AC
  Filled 2015-11-02: qty 2

## 2015-11-02 MED ORDER — ROCURONIUM BROMIDE 100 MG/10ML IV SOLN
INTRAVENOUS | Status: AC
  Filled 2015-11-02: qty 1

## 2015-11-02 MED ORDER — PHENYLEPHRINE HCL 10 MG/ML IJ SOLN
INTRAMUSCULAR | Status: AC
  Filled 2015-11-02: qty 1

## 2015-11-02 MED ORDER — LIDOCAINE HCL (CARDIAC) 20 MG/ML IV SOLN
INTRAVENOUS | Status: AC
  Filled 2015-11-02: qty 5

## 2015-11-02 MED ORDER — SODIUM CHLORIDE 0.9 % IV SOLN: Freq: Once | INTRAVENOUS | Status: DC

## 2015-11-02 MED ORDER — MIDAZOLAM HCL 2 MG/2ML IJ SOLN
INTRAMUSCULAR | Status: AC
  Filled 2015-11-02: qty 2

## 2015-11-02 MED ORDER — SODIUM CHLORIDE 0.9 % IJ SOLN
INTRAMUSCULAR | Status: AC
  Filled 2015-11-02: qty 20

## 2015-11-02 MED ORDER — ONDANSETRON HCL 4 MG/2ML IJ SOLN
INTRAMUSCULAR | Status: DC | PRN
  Administered 2015-11-02: 4 mg via INTRAVENOUS

## 2015-11-02 MED ORDER — MIDAZOLAM HCL 5 MG/5ML IJ SOLN
INTRAMUSCULAR | Status: DC | PRN
  Administered 2015-11-02: 0.5 mg via INTRAVENOUS
  Administered 2015-11-02: 1 mg via INTRAVENOUS
  Administered 2015-11-02: 0.5 mg via INTRAVENOUS

## 2015-11-02 MED ORDER — PHENYLEPHRINE HCL 10 MG/ML IJ SOLN
INTRAMUSCULAR | Status: DC | PRN
  Administered 2015-11-02 (×2): 40 ug via INTRAVENOUS

## 2015-11-02 NOTE — H&P (View-Only) (Signed)
Subjective:    Patient ID: Katherine Bullock, female    DOB: 1945/05/30, 70 y.o.   MRN: [id number]  HPI Patient reports she developed a cough productive of a yellow mucus for 1 week prior to presenting to her PCP. She also had a "soreness" in her chest radiating to her right axilla and back. Denies any hemoptysis. No fever, chills, or sweats. She reports her baseline dyspnea, nothing new. No dysphagia, odynophagia, nausea, or emesis. No abdominal pain. No melena or hematochezia. No headaches. No focal weakness, numbness, or tingling. No adenopathy in her neck, groin, or axilla. No vaginal discharge. Patient had colonoscopy approximately 1 year ago. She is scheduled for a PET CT on Friday.   Review of Systems No rashes or bruising. No hematuria or dysuria. A pertinent 14 point review of systems is negative except as per the history of presenting illness.  Allergies  Allergen Reactions  . Amitriptyline Itching  . Flexeril [Cyclobenzaprine Hcl] Itching    Current Outpatient Prescriptions on File Prior to Visit  Medication Sig Dispense Refill  . amLODipine (NORVASC) 5 MG tablet Take 5 mg by mouth daily.    Marland Kitchen aspirin 81 MG tablet Take 81 mg by mouth daily.    Marland Kitchen atorvastatin (LIPITOR) 40 MG tablet Take 40 mg by mouth daily.    . fenofibrate 160 MG tablet Take 160 mg by mouth daily.    . hydrALAZINE (APRESOLINE) 25 MG tablet Take 25 mg by mouth 3 (three) times daily.    Marland Kitchen lisinopril (PRINIVIL,ZESTRIL) 20 MG tablet Take 20 mg by mouth 2 (two) times daily.    . metFORMIN (GLUCOPHAGE) 1000 MG tablet Take 1,000 mg by mouth 2 (two) times daily.    Marland Kitchen spironolactone (ALDACTONE) 25 MG tablet Take 25 mg by mouth daily.     No current facility-administered medications on file prior to visit.    Past Medical History  Diagnosis Date  . Diabetes mellitus   . Hypertension   . Hyperlipidemia   . Arthritis   . Cataract   . Stroke ([place])   . Cholelithiases     Past Surgical History  Procedure Laterality  Date  . Tubal ligation    . Abdominal hysterectomy    . Cholecystectomy    . Mandible surgery    . Appendectomy    . Bladder surgery    . Cataract extraction Bilateral     Family History  Problem Relation Age of Onset  . Diabetes Mother   . Heart disease Mother   . Breast cancer Mother   . Hyperlipidemia Mother   . Hypertension Mother   . Heart attack Mother   . Heart disease Father   . Heart attack Father   . Hypertension Son   . Breast cancer Maternal Aunt   . Lung disease Neg Hx     Social History   Social History  . Marital Status: Married    Spouse Name: N/A  . Number of Children: N/A  . Years of Education: N/A   Occupational History  . retired    Social History Main Topics  . Smoking status: Current Every Day Smoker -- 1.00 packs/day for 50 years    Types: Cigarettes    Start date: 05/09/1965  . Smokeless tobacco: Current User    Types: Snuff     Comment: Peak rate of 1ppd  . Alcohol Use: No  . Drug Use: No  . Sexual Activity: Not Asked   Other Topics Concern  .  None   Social History Narrative   Originally from [place]. Previously worked Psychologist, sport and exercise. She recently just got a dog. No bird exposure. No mold exposure.        Objective:   Physical Exam BP 126/78 mmHg  Pulse 72  Ht '5\' 4"'$  (1.626 m)  Wt 134 lb 12.8 oz (61.145 kg)  BMI 23.13 kg/m2  SpO2 97% General:  Awake. Alert. No acute distress. Daughter with her today.  Integument:  Warm & dry. No rash on exposed skin. No bruising. Lymphatics:  No appreciated cervical or supraclavicular lymphadenoapthy. HEENT:  Moist mucus membranes. No oral ulcers. No scleral injection or icterus.  Cardiovascular:  Regular rate. No edema. No appreciable JVD.  Pulmonary:  Good aeration & clear to auscultation bilaterally. Symmetric chest wall expansion. No accessory muscle use. Abdomen: Soft. Normal bowel sounds. Nondistended. Grossly nontender. Musculoskeletal:  Normal bulk and tone. Hand grip strength 5/5  bilaterally. No joint deformity or effusion appreciated. Neurological:  CN 2-12 grossly in tact. No meningismus. Moving all 4 extremities equally. Symmetric brachioradialis deep tendon reflexes. Psychiatric:  Mood and affect congruent. Speech normal rhythm, rate & tone.   IMAGING CT CHEST W/O 10/22/15 (personally reviewed by me): Large subcarinal heterogeneous mass. There is questionable a subcarinal nodule on CT imaging form 2015 by my review. No pathologic lymphadenopathy. No pleural effusion or thickening. Apical predominant emphysematous changes. No pericardial effusion.    Assessment & Plan:  70 year old female found to have subcarinal mass.Suspect underlying malignancy. I will follow up on PET/CT imaging. Plan for bronchoscopy with airway exam and into bronchial ultrasound exam with fine-needle aspiration on 6/26. I did discuss the risks of the procedure including bleeding, infection, pneumothorax, vocal cord injury, medication allergy, and potentially death. The patient is willing to undergo the procedure. I suspect her underlying emphysema secondary to her chronic tobacco use but we must screen her for alpha-1 antitrypsin deficiency. I instructed the patient contact my office if she had any new breathing problems or questions before her next appointment.  1. Subcarinal Mass:  Plan to follow-up on PET CT. Checking serum CBC & BMP. Plan for bronchoscopy with endobronchial ultrasound and biopsy on 6/26 at 7:30 AM. 2. Emphysema: Full only function testing ASAP & screening for alpha-1 antitrypsin deficiency. 3. Ongoing Tobacco Use: Patient counseled for over 3 minutes and need for complete tobacco cessation. 4. Follow-up: Patient to return to clinic in 2-3 weeks.  Sonia Baller Ashok Cordia, M.D. [facility] Pulmonary & Critical Care Pager:  [phone] After 3pm or if no response, call [phone] 12:43 PM 10/27/2015

## 2015-11-02 NOTE — Discharge Instructions (Addendum)
Seeking immediate medical attention for any chest pain, difficulty breathing, or persistent bloody phlegm. Contact my office if you develop any persistent fever or cough productive of phlegm that is yellow, green, or pus-colored.   General Anesthesia, Adult, Care After Refer to this sheet in the next few weeks. These instructions provide you with information on caring for yourself after your procedure. Your health care provider may also give you more specific instructions. Your treatment has been planned according to current medical practices, but problems sometimes occur. Call your health care provider if you have any problems or questions after your procedure. WHAT TO EXPECT AFTER THE PROCEDURE After the procedure, it is typical to experience:  Sleepiness.  Nausea and vomiting. HOME CARE INSTRUCTIONS  For the first 24 hours after general anesthesia:  Have a responsible person with you.  Do not drive a car. If you are alone, do not take public transportation.  Do not drink alcohol.  Do not take medicine that has not been prescribed by your health care provider.  Do not sign important papers or make important decisions.  You may resume a normal diet and activities as directed by your health care provider.  Change bandages (dressings) as directed.  If you have questions or problems that seem related to general anesthesia, call the hospital and ask for the anesthetist or anesthesiologist on call. SEEK MEDICAL CARE IF:  You have nausea and vomiting that continue the day after anesthesia.  You develop a rash. SEEK IMMEDIATE MEDICAL CARE IF:   You have difficulty breathing.  You have chest pain.  You have any allergic problems.   This information is not intended to replace advice given to you by your health care provider. Make sure you discuss any questions you have with your health care provider.   Document Released: 08/01/2000 Document Revised: 05/16/2014 Document Reviewed:  08/24/2011 Elsevier Interactive Patient Education [employer].

## 2015-11-02 NOTE — Interval H&P Note (Signed)
History and Physical Interval Note:  11/02/2015 7:29 AM  Katherine Bullock  has presented today for surgery, with the diagnosis of Lung mass  The various methods of treatment have been discussed with the patient and family. After consideration of risks, benefits and other options for treatment, the patient has consented to  Procedure(s): ENDOBRONCHIAL ULTRASOUND (Bilateral) as a surgical intervention .  The patient's history has been reviewed, patient examined, no change in status, stable for surgery.  I have reviewed the patient's chart and labs.  Questions were answered to the patient's satisfaction.     Sonia Baller Ashok Cordia, M.D. [facility] Pulmonary & Critical Care Pager:  [phone] After 3pm or if no response, call [phone] 7:30 AM 11/02/2015

## 2015-11-02 NOTE — Anesthesia Procedure Notes (Signed)
Procedure Name: Intubation Date/Time: 11/02/2015 8:12 AM Performed by: Noralyn Pick D Pre-anesthesia Checklist: Patient identified, Emergency Drugs available, Suction available and Patient being monitored Patient Re-evaluated:Patient Re-evaluated prior to inductionOxygen Delivery Method: Circle system utilized Preoxygenation: Pre-oxygenation with 100% oxygen Intubation Type: IV induction Ventilation: Mask ventilation without difficulty Laryngoscope Size: Mac and 4 Tube type: Oral Tube size: 8.5 mm Number of attempts: 1 Airway Equipment and Method: Stylet and Oral airway Placement Confirmation: ETT inserted through vocal cords under direct vision,  positive ETCO2 and breath sounds checked- equal and bilateral Secured at: 20 cm Tube secured with: Tape Dental Injury: Teeth and Oropharynx as per pre-operative assessment

## 2015-11-02 NOTE — Op Note (Signed)
Video Bronchoscopy Procedure Note  Pre-Procedure Diagnoses: 1.  Subcarinal Lung Mass  Procedures Performed: 1. Bronchoscopy with Airway Inspection 2. Endobronchial Ultrasound with Needle Aspiration  Consent:  Informed consent was obtained from the patient after discussing the risks and benefits of the procedure including bleeding, infection, pneumothorax, medication allergy, vocal chord injury, and potentially death.  Sedation:  Refer to documentation by anesthesia.  Medications Administered During Conscious Sedation: 1. Lidocaine 1% gargle 10cc 2. Cetacaine Spray 3 Sprays Retropharyngeal Pre-Procedure 3. Sedation as per Anesthesia Documentation  Pre-Procedure Physical Exam: General:  No acute distress. Awake. Alert. ASA Class 2. HEENT:  Moist mucus membranes. No oral ulcers. Mallampati Class II. Cardiovascular:  Regular rate. No edema. No appreciable JVD. Pulmonary:  Clear to auscultation with good aeration bilaterally. Normal work of breathing. Abdomen:  Soft. Nontender. Nondistended. Normal bowel sounds. Musculoskeletal:  Normal bulk and tone. Normal neck flexion & extension. Neurological:  Oriented to person, place, and time. Moving all 4 extremities equally.  Description of Procedure: Patient was brought back to the endoscopy procedure room.  A time out was performed to identify the correct patient and procedure.  Lidocaine gargle was performed.  Patient was laid recumbent and conscious sedation was administered by anesthesia.  Bite block was inserted and towel was placed over the patient's eyes.  The patient developed desaturation and apnea with conscious sedation requiring endotracheal intubation & initiation of general anesthesia. Flexible bronchoscope was then inserted into the endotracheal tube after intubation. The bronchoscope was then advanced into the distal trachea.  Endotracheal tube was repositioned with direct visualization. An airway inspeciton was performed finding  normal mucosa with scant, clear secretions. The flexible bronchoscope was then removed from the patient's airways after suctioning of the remaining secretions.  The endobronchial ultrasound scope was then inserted into the endotracheal tube.  An node at level 4R was identified with endobronchial ultrasound.  Under direct visualization with the ultrasound and a 22 gauge needle a total of 4 passes were performed at level 4R. The endobronchial ultrasound scope was then repositioned to level 7. A total of 6 passes were performed at this location under direct visualization with ultrasound with the same 22 gauge needle. The endobronchial ultrasound scope was removed twice, once for removal of ultrasound balloon and wants to apply additional lubrication to the surface of the scope. Hemostasis was directly visualized.  The remaining secretions were suctioned from the patient's airways and the endobronchial ultrasound scope was removed.    Blood Loss:  Less than 5cc.  Complications:  Patient did not tolerate conscious sedation and had apnea.  This resulted in transient desaturation and premature ventricular contractions that resolved once patient ws intubated and oxygenation improved. The Cook needle that I was using developed what appeared to be bending of the needle & sheath during the procedure resulting in migration of the needle and sheath and subsequent inaccuracy with deployment as well as with sampling. At no time did the needle go outside of the visualized lymph node or mass but 2 separate biopsy passes were stopped to prevent unsafe biopsies.   EBUS Procedure: Level 4R:  0.5-1cm lymph node / biopsy performed / ROSE / passes 1 - 4 for cytology & cell block  Level 7:  Mass >3cm / biopsy performed / ROSE / passes 1 for cytology & cell block; passes 2-4 for cell block; passes 5 & 6 in saline for RPMI  Post Procedure Stat Portable CXR:  Ordered and pending.  Post Procedure Instructions: Personally spoke  with the patient's family relaying the preliminary results of the procedure.  Instructed them that the patient is not to operate a car for 24 hours.  The patient should seek immediate medical attention if there is any persistent or progressive hemoptysis, difficulty breathing, or chest pain/discomfort.  The patient should also notify me immediately or seek medical attention for any purulent sputum production or fever occuring today or in the coming days.  The patient will be contacted by me once the final results of the studies are available.  The patient should call my office if they have any questions.

## 2015-11-02 NOTE — Anesthesia Postprocedure Evaluation (Signed)
Anesthesia Post Note  Patient: Katherine Bullock  Procedure(s) Performed: Procedure(s) (LRB): ENDOBRONCHIAL ULTRASOUND (Bilateral)  Patient location during evaluation: Endoscopy Anesthesia Type: General Level of consciousness: awake and alert Pain management: pain level controlled Vital Signs Assessment: post-procedure vital signs reviewed and stable Respiratory status: spontaneous breathing, nonlabored ventilation, respiratory function stable and patient connected to nasal cannula oxygen Cardiovascular status: blood pressure returned to baseline and stable Postop Assessment: no signs of nausea or vomiting Anesthetic complications: no    Last Vitals:  Filed Vitals:   11/02/15 0910 11/02/15 0920  BP: 137/97 157/77  Pulse: 92 85  Temp:    Resp: 21 20    Last Pain: There were no vitals filed for this visit.               Catalina Gravel

## 2015-11-02 NOTE — Transfer of Care (Signed)
Immediate Anesthesia Transfer of Care Note  Patient: Katherine Bullock  Procedure(s) Performed: Procedure(s): ENDOBRONCHIAL ULTRASOUND (Bilateral)  Patient Location: PACU  Anesthesia Type:General  Level of Consciousness: awake, alert  and oriented  Airway & Oxygen Therapy: Patient Spontanous Breathing and Patient connected to nasal cannula oxygen  Post-op Assessment: Report given to RN and Post -op Vital signs reviewed and stable  Post vital signs: Reviewed and stable  Last Vitals:  Filed Vitals:   11/02/15 0714  BP: 194/78  Pulse: 72  Temp: 36.4 C  Resp: 23    Last Pain: There were no vitals filed for this visit.       Complications: No apparent anesthesia complications

## 2015-11-03 ENCOUNTER — Encounter (HOSPITAL_COMMUNITY): Payer: Self-pay | Admitting: Pulmonary Disease

## 2015-11-04 ENCOUNTER — Other Ambulatory Visit (INDEPENDENT_AMBULATORY_CARE_PROVIDER_SITE_OTHER): Payer: PPO

## 2015-11-04 ENCOUNTER — Telehealth: Payer: Self-pay | Admitting: Pulmonary Disease

## 2015-11-04 ENCOUNTER — Encounter: Payer: Self-pay | Admitting: Pulmonary Disease

## 2015-11-04 ENCOUNTER — Ambulatory Visit (INDEPENDENT_AMBULATORY_CARE_PROVIDER_SITE_OTHER): Payer: PPO | Admitting: Pulmonary Disease

## 2015-11-04 VITALS — BP 140/70 | HR 78 | Ht 64.0 in | Wt 134.4 lb

## 2015-11-04 DIAGNOSIS — R0602 Shortness of breath: Secondary | ICD-10-CM

## 2015-11-04 DIAGNOSIS — M791 Myalgia, unspecified site: Secondary | ICD-10-CM

## 2015-11-04 DIAGNOSIS — R918 Other nonspecific abnormal finding of lung field: Secondary | ICD-10-CM | POA: Diagnosis not present

## 2015-11-04 LAB — HEPATIC FUNCTION PANEL
ALBUMIN: 4.3 g/dL (ref 3.5–5.2)
ALT: 19 U/L (ref 0–35)
AST: 25 U/L (ref 0–37)
Alkaline Phosphatase: 95 U/L (ref 39–117)
BILIRUBIN TOTAL: 0.5 mg/dL (ref 0.2–1.2)
Bilirubin, Direct: 0 mg/dL (ref 0.0–0.3)
Total Protein: 8 g/dL (ref 6.0–8.3)

## 2015-11-04 LAB — CBC WITH DIFFERENTIAL/PLATELET
BASOS ABS: 0.1 10*3/uL (ref 0.0–0.1)
Basophils Relative: 0.5 % (ref 0.0–3.0)
EOS ABS: 0.3 10*3/uL (ref 0.0–0.7)
Eosinophils Relative: 2.9 % (ref 0.0–5.0)
HEMATOCRIT: 41.6 % (ref 36.0–46.0)
Hemoglobin: 14 g/dL (ref 12.0–15.0)
LYMPHS PCT: 29.2 % (ref 12.0–46.0)
Lymphs Abs: 3.5 10*3/uL (ref 0.7–4.0)
MCHC: 33.5 g/dL (ref 30.0–36.0)
MCV: 91.9 fl (ref 78.0–100.0)
MONO ABS: 0.8 10*3/uL (ref 0.1–1.0)
Monocytes Relative: 6.4 % (ref 3.0–12.0)
NEUTROS ABS: 7.3 10*3/uL (ref 1.4–7.7)
Neutrophils Relative %: 61 % (ref 43.0–77.0)
PLATELETS: 432 10*3/uL — AB (ref 150.0–400.0)
RBC: 4.53 Mil/uL (ref 3.87–5.11)
RDW: 12.9 % (ref 11.5–15.5)
WBC: 12 10*3/uL — ABNORMAL HIGH (ref 4.0–10.5)

## 2015-11-04 LAB — COMPREHENSIVE METABOLIC PANEL
ALBUMIN: 4.3 g/dL (ref 3.5–5.2)
ALT: 19 U/L (ref 0–35)
AST: 25 U/L (ref 0–37)
Alkaline Phosphatase: 95 U/L (ref 39–117)
BILIRUBIN TOTAL: 0.5 mg/dL (ref 0.2–1.2)
BUN: 7 mg/dL (ref 6–23)
CALCIUM: 10.1 mg/dL (ref 8.4–10.5)
CO2: 28 meq/L (ref 19–32)
CREATININE: 0.79 mg/dL (ref 0.40–1.20)
Chloride: 98 mEq/L (ref 96–112)
GFR: 76.3 mL/min (ref >60.00)
Glucose, Bld: 143 mg/dL — ABNORMAL HIGH (ref 70–99)
Potassium: 3.9 mEq/L (ref 3.5–5.1)
SODIUM: 137 meq/L (ref 135–145)
Total Protein: 8 g/dL (ref 6.0–8.3)

## 2015-11-04 LAB — CREATININE KINASE MB: CK-MB: 1.6 ng/mL (ref 0.3–4.0)

## 2015-11-04 LAB — SEDIMENTATION RATE: SED RATE: 31 mm/h — AB (ref 0–30)

## 2015-11-04 LAB — C-REACTIVE PROTEIN: CRP: 3.4 mg/dL (ref 0.5–20.0)

## 2015-11-04 LAB — CK: Total CK: 154 U/L (ref 7–177)

## 2015-11-04 MED ORDER — KETOROLAC TROMETHAMINE 10 MG PO TABS: 10.0000 mg | ORAL_TABLET | ORAL | Status: DC

## 2015-11-04 NOTE — Telephone Encounter (Signed)
Pt was seen today with Dr Tilden Dome -- Dr Vaughan Browner forgot to mention that he wanted the patient to have a CXR before she left - this was not on the AVS.  Dr Vaughan Browner requests that she come back in to have CXR done. Order placed.  LM for pt to return call

## 2015-11-04 NOTE — Telephone Encounter (Signed)
Called spoke with pt. She reports her arms is very achy. Legs/back hurts very much and can't stand. She reports she is using heating pad and took norco for the pain. She reports she is in pain all over since having the biopsy done. Pt is wanting to know if this has anything to do with the biopsy. Please advise Dr. Ashok Cordia thanks  Allergies  Allergen Reactions  . Amitriptyline Itching  . Flexeril [Cyclobenzaprine Hcl] Itching

## 2015-11-04 NOTE — Patient Instructions (Signed)
Continue the norco as prescribed We will add toradol for pain control  Continue the warming pads, hydration.  Follow up with Dr. Ashok Cordia.

## 2015-11-04 NOTE — Telephone Encounter (Signed)
There's no reason she should be "hurting all over" from the biopsy. We should try to get her in today. Let me know if this is not possible. Thanks.

## 2015-11-04 NOTE — Telephone Encounter (Signed)
Spoke with pt and scheduled for 1:30pm today with PM. Pt advised if she develops fever or starts coughing up blood to go to ED immediately.

## 2015-11-04 NOTE — Progress Notes (Signed)
Subjective:    Patient ID: Katherine Bullock, female    DOB: [birth year], 70 y.o.   MRN: [id number]  HPI Acute visit for body pain.  Katherine Bullock is a 70 year old who underwent an EBUS biopsy of a right hilar, subcarinal mass on 6/26 under general anesthesia. She was feeling well on the day of the procedure. However the next day she woke up with muscle aches in her arms, legs, buttocks and back. She was given Norco by her primary care physician which has not helped. She has also taken alleve OTC without relief. She denies any cough, sputum production, fevers, chills, hemoptysis, chest pain, palpitation, dyspnea, wheezing.  Past Medical History  Diagnosis Date  . Diabetes mellitus   . Hypertension   . Hyperlipidemia   . Arthritis   . Cataract   . Stroke ([place])   . Cholelithiases    , Current outpatient prescriptions:  .  amLODipine (NORVASC) 5 MG tablet, Take 5 mg by mouth daily., Disp: , Rfl:  .  aspirin 81 MG tablet, Take 162 mg by mouth daily. , Disp: , Rfl:  .  atorvastatin (LIPITOR) 40 MG tablet, Take 40 mg by mouth daily., Disp: , Rfl:  .  citalopram (CELEXA) 40 MG tablet, Take 40 mg by mouth daily., Disp: , Rfl:  .  fenofibrate 160 MG tablet, Take 160 mg by mouth daily., Disp: , Rfl:  .  glimepiride (AMARYL) 1 MG tablet, Take 0.5 mg by mouth daily with breakfast., Disp: , Rfl:  .  hydrALAZINE (APRESOLINE) 25 MG tablet, Take 25 mg by mouth 3 (three) times daily., Disp: , Rfl:  .  Insulin Glargine (LANTUS SOLOSTAR) 100 UNIT/ML Solostar Pen, Inject 60 Units into the skin 2 (two) times daily., Disp: , Rfl:  .  lisinopril (PRINIVIL,ZESTRIL) 20 MG tablet, Take 20 mg by mouth 2 (two) times daily., Disp: , Rfl:  .  metFORMIN (GLUCOPHAGE) 1000 MG tablet, Take 1,000 mg by mouth 2 (two) times daily., Disp: , Rfl:  .  Multiple Vitamins-Minerals (ZINC PO), Take 1 tablet by mouth daily., Disp: , Rfl:  .  spironolactone (ALDACTONE) 25 MG tablet, Take 25 mg by mouth daily., Disp: , Rfl:  .   HYDROcodone-acetaminophen (NORCO/VICODIN) 5-325 MG tablet, , Disp: , Rfl:   Review of Systems Generalized muscle pain. Stiffness over the shoulder. No fevers, chills, cough, sputum production, hemoptysis, dyspnea. No chest pain, palpitations. No nausea, vomiting, diarrhea, constipation. All other review of systems negative.    Objective:   Physical Exam Blood pressure 140/70, pulse 78, height '5\' 4"'$  (1.626 m), weight 134 lb 6.4 oz (60.963 kg), SpO2 97 %. Gen: Moderate distress Neuro: No gross focal deficits. HEENT: No JVD, lymphadenopathy, thyromegaly. RS: Clear, No wheeze or crackles CVS: S1-S2 heard, no murmurs rubs gallops. Abdomen: Soft, positive bowel sounds. Musculoskeletal: No edema. Generalized tenderness on palpation over the proximal muscles of arms, legs and back. No loss of power noted. Skin- No rash.      Assessment & Plan:  Body ache, muscle ache after EBUS biopsy.  I don't believe her presentation is directly related to the biopsy itself as there is no symptoms confined to the chest. She may have developed myositis, muscle pain from immobilization during general anesthesia. She is on Lipitor but this is not a new medication.  Reccs: Continue Norco as prescribed. Add Toradol 10 mg evey 6 hrs as needed. Check Chest x ray and labs including CMP, CBC, CK, CK-MB, aldolase, myoglobin, sed rate, CRP Encouraged  her to continue warm compress. Keep up with hydration by drinking lots of water.   Katherine Garfinkel MD [place] [facility] Pager [phone] If no answer or after 3pm call: [phone] 11/04/2015, 2:10 PM

## 2015-11-05 LAB — MYOGLOBIN, SERUM: Myoglobin: 35 mcg/L (ref <=66)

## 2015-11-06 ENCOUNTER — Encounter: Payer: Self-pay | Admitting: Pulmonary Disease

## 2015-11-06 ENCOUNTER — Ambulatory Visit (INDEPENDENT_AMBULATORY_CARE_PROVIDER_SITE_OTHER): Payer: PPO | Admitting: Pulmonary Disease

## 2015-11-06 ENCOUNTER — Ambulatory Visit (INDEPENDENT_AMBULATORY_CARE_PROVIDER_SITE_OTHER)
Admission: RE | Admit: 2015-11-06 | Discharge: 2015-11-06 | Disposition: A | Discharge status: Home / Self Care | Payer: PPO | Source: Ambulatory Visit | Attending: Pulmonary Disease | Admitting: Pulmonary Disease

## 2015-11-06 VITALS — BP 138/76 | HR 70 | Ht 64.0 in | Wt 134.8 lb

## 2015-11-06 DIAGNOSIS — R0602 Shortness of breath: Secondary | ICD-10-CM | POA: Diagnosis not present

## 2015-11-06 DIAGNOSIS — J439 Emphysema, unspecified: Secondary | ICD-10-CM

## 2015-11-06 DIAGNOSIS — F17200 Nicotine dependence, unspecified, uncomplicated: Secondary | ICD-10-CM

## 2015-11-06 DIAGNOSIS — M791 Myalgia, unspecified site: Secondary | ICD-10-CM

## 2015-11-06 DIAGNOSIS — R918 Other nonspecific abnormal finding of lung field: Secondary | ICD-10-CM

## 2015-11-06 DIAGNOSIS — Z148 Genetic carrier of other disease: Secondary | ICD-10-CM

## 2015-11-06 DIAGNOSIS — E8801 Alpha-1-antitrypsin deficiency: Secondary | ICD-10-CM | POA: Diagnosis not present

## 2015-11-06 LAB — ALDOLASE

## 2015-11-06 NOTE — Progress Notes (Signed)
Subjective:    Patient ID: Katherine Bullock, female    DOB: [birth year], 70 y.o.   MRN: [id number]  C.C.:  Follow-up for Lung/Mediastinal Mass, Emphysema, Tobacco Use, & Myalgias.  HPI Lung/Mediastinal Mass:  EBUS FNA cytology is indeterminate and flow cytometry was negative despite markedly atypical cells.Patient reports no chest pain or pressure currently. No vision changes or focal weakness, numbness, or tingling.  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  PFTs are pending. Patient continues to have intermittent coughing productive of clear phlegm. Denies any wheezing. Denies any significant dyspnea.  Tobacco Use: Patient reports she is continuing to use approximately one half pack per day of cigarettes. Has not previously tried to quit utilizing nicotine replacement.  Myalgias:  Aldolase is pending but other workup negative from 6/28. Patient continued on Norco by Dr. Vaughan Browner & Toradol added. Patient reports myalgias are resolving.  Review of Systems No abdominal pain, dysphagia, or odynophagia. No subjective fever, chills, or sweats.  Allergies  Allergen Reactions  . Amitriptyline Itching  . Flexeril [Cyclobenzaprine Hcl] Itching    Current Outpatient Prescriptions on File Prior to Visit  Medication Sig Dispense Refill  . amLODipine (NORVASC) 5 MG tablet Take 5 mg by mouth daily.    Marland Kitchen aspirin 81 MG tablet Take 162 mg by mouth daily.     Marland Kitchen atorvastatin (LIPITOR) 40 MG tablet Take 40 mg by mouth daily.    . citalopram (CELEXA) 40 MG tablet Take 40 mg by mouth daily.    . fenofibrate 160 MG tablet Take 160 mg by mouth daily.    Marland Kitchen glimepiride (AMARYL) 1 MG tablet Take 0.5 mg by mouth daily with breakfast.    . hydrALAZINE (APRESOLINE) 25 MG tablet Take 25 mg by mouth 3 (three) times daily.    Marland Kitchen HYDROcodone-acetaminophen (NORCO/VICODIN) 5-325 MG tablet     . Insulin Glargine (LANTUS SOLOSTAR) 100 UNIT/ML Solostar Pen Inject 60 Units into the skin 2  (two) times daily.    Marland Kitchen ketorolac (TORADOL) 10 MG tablet Take 1 tablet (10 mg total) by mouth every 4 (four) hours. 30 tablet 0  . lisinopril (PRINIVIL,ZESTRIL) 20 MG tablet Take 20 mg by mouth 2 (two) times daily.    . metFORMIN (GLUCOPHAGE) 1000 MG tablet Take 1,000 mg by mouth 2 (two) times daily.    . Multiple Vitamins-Minerals (ZINC PO) Take 1 tablet by mouth daily.    Marland Kitchen spironolactone (ALDACTONE) 25 MG tablet Take 25 mg by mouth daily.     No current facility-administered medications on file prior to visit.    Past Medical History  Diagnosis Date  . Diabetes mellitus   . Hypertension   . Hyperlipidemia   . Arthritis   . Cataract   . Stroke ([place])   . Cholelithiases     Past Surgical History  Procedure Laterality Date  . Tubal ligation    . Abdominal hysterectomy    . Cholecystectomy    . Mandible surgery    . Appendectomy    . Bladder surgery    . Cataract extraction Bilateral   . Endobronchial ultrasound Bilateral 11/02/2015    Procedure: ENDOBRONCHIAL ULTRASOUND;  Surgeon: Javier Glazier, MD;  Location: [facility];  Service: Cardiopulmonary;  Laterality: Bilateral;    Family History  Problem Relation Age of Onset  . Diabetes Mother   . Heart disease Mother   . Breast cancer Mother   . Hyperlipidemia Mother   . Hypertension Mother   .  Heart attack Mother   . Heart disease Father   . Heart attack Father   . Hypertension Son   . Breast cancer Maternal Aunt   . Lung disease Neg Hx     Social History   Social History  . Marital Status: Widowed    Spouse Name: N/A  . Number of Children: N/A  . Years of Education: N/A   Occupational History  . retired    Social History Main Topics  . Smoking status: Current Every Day Smoker -- 1.00 packs/day for 50 years    Types: Cigarettes    Start date: 05/09/1965  . Smokeless tobacco: Current User    Types: Snuff     Comment: Peak rate of 1ppd  . Alcohol Use: No  . Drug Use: No  . Sexual Activity: Not Asked    Other Topics Concern  . None   Social History Narrative   Originally from [place]. Previously worked Psychologist, sport and exercise. She recently just got a dog. No bird exposure. No mold exposure.        Objective:   Physical Exam BP 138/76 mmHg  Pulse 70  Ht _0  (1.626 m)  Wt 134 lb 12.8 oz (61.145 kg)  BMI 23.13 kg/m2  SpO2 96% General:  Awake. Alert. No distress. Daughter and sister with her today.  Integument:  Warm & dry. No rash on exposed skin. No bruising. Lymphatics:  No appreciated cervical or supraclavicular lymphadenoapthy. HEENT:  Moist mucus membranes. No oral ulcers. No scleral injection. No nasal turbinate swelling. Cardiovascular:  Regular rate. No edema. Normal S1 & S2. Pulmonary:  Clear bilaterally to auscultation. Normal work of breathing on room air. No accessory muscle use. Neurological:  CN 2-12 grossly in tact. No meningismus. Moving all 4 extremities equally.   IMAGING CXR PA/LAT 11/06/15 (personally reviewed by me): Right hilar/subcarinal mass unchanged. No parenchymal nodule or opacity appreciated. No pleural effusion. Heart normal in size & mediastinum otherwise normal in contour. No pneumothorax.  PET CT 10/30/15 (previously reviewed by me): 8 cm central right lung mass invading right hilum and mediastinum markedly hypermetabolic. Borderline enlargement of right supraclavicular lymph nodes without hypermetabolism. Trace right pleural effusion. Subcentimeter precarinal lymph node noted as well.  CT CHEST W/O 10/22/15 (previously reviewed by me): Large subcarinal heterogeneous mass. There is questionable a subcarinal nodule on CT imaging form 2015 by my review. No pathologic lymphadenopathy. No pleural effusion or thickening. Apical predominant emphysematous changes. No pericardial effusion.  PATHOLOGY EBUS FNA (11/02/15):  Level 7 - Atypical Cells Suspicious for Malignancy / Level 4R - Atypical Cells Suspicious for Malignancy & Negative Flow Cytometry  LABS 11/04/15 CBC:  20.0/14.0/41.6/432 BMP: 137/3.9/98/28/7/0.79/143/10.1 LFT: 4.3/8.0/0.5/95/25/19 CRP: 3.4 ESR:  31 CK: 134 CK-MB: 1.6 Myoglobin: 35 Aldolase: Pending  10/27/15 Alpha-1 antitrypsin: MZ (112)    Assessment & Plan:  70 year old female found to have subcarinal mass and emphysema. Found on serum testing to be a carrier for alpha-1 antitrypsin deficiency.I reviewed patient's serum lab tests which did show carrier status with family present and advised patient and family to have biological relatives tested for possible alpha-1 antitrypsin deficiency given the potential for liver and lung disease without drug use. We also discussed initiating empiric inhaler therapy with Spiriva for treatment of her underlying emphysema and resume COPD. However, the patient does not wish to initiate inhaler therapy at this time with her perceived minimal symptoms. I had a lengthy discussion regarding her PET/CT scan imaging results as well as the results of her  biopsy which shows significantly atypical cells but do not point to a specific diagnosis of malignancy. I am suspicious that this could represent a lymphoma which certainly would require more tissue for diagnosis. Case was discussed with pathologist today as well. The best mode of biopsy at this time would be mediastinoscopy. I instructed the patient to contact my office if she had any further questions or concerns before her next appointment.   1. Lung/Mediastinal Mass:  Referring patient to thoracic surgery for mediastinoscopy and biopsy. 2. Emphysema: Patient counseled about having biological relatives tested for alpha-1 antitrypsin deficiency. Full pulmonary function testing pending. Patient declining inhaler therapy. Repeat spirometry at next appointment. 3. Tobacco Use: Patient counseled for over 3 minutes and need for complete tobacco cessation. 4. Myalgias:  Likely a reaction to the general anesthesia as it is resolving. Instructed patient to notify me if  pain recurred. 5. Health Maintenance: Received influenza vaccine October 2016 & Pneumovax January 2014. Patient to check with her primary care physician whether or not she has had the Prevnar vaccine.  6. Follow-up: Patient to return to clinic in July as previously scheduled.  Sonia Baller Ashok Cordia, M.D. [facility] Pulmonary [facility] Critical [facility] Pager:  [phone] After 3pm or if no response, call [phone] 4:44 PM 11/06/2015

## 2015-11-06 NOTE — Patient Instructions (Signed)
   Let me know if you have any trouble breathing before your next appointment.  Please call me if you have any new questions.  Remember to check with your primary care physician to see if you have had the Prevnar 13 vaccine.  We will do a breathing test at your next appointment.  I am referring you to a thoracic surgeon to get a Mediastinoscopy to diagnose your lung mass.  I will see back at your scheduled appointment in July or sooner if needed.  TESTS ORDERED: 1. Spirometry with bronchodilator challenge at next appointment

## 2015-11-16 ENCOUNTER — Encounter: Payer: Self-pay | Admitting: Thoracic Surgery (Cardiothoracic Vascular Surgery)

## 2015-11-16 ENCOUNTER — Ambulatory Visit (INDEPENDENT_AMBULATORY_CARE_PROVIDER_SITE_OTHER): Payer: PPO | Admitting: Thoracic Surgery (Cardiothoracic Vascular Surgery)

## 2015-11-16 VITALS — BP 138/74 | HR 80 | Resp 16 | Ht 64.0 in | Wt 134.0 lb

## 2015-11-16 DIAGNOSIS — R918 Other nonspecific abnormal finding of lung field: Secondary | ICD-10-CM | POA: Diagnosis not present

## 2015-11-16 NOTE — Progress Notes (Signed)
PCP is HODGES,BETH, MD Referring Provider is Javier Glazier, MD  Chief Complaint  Patient presents with  . Lung Mass    RIGHT..CT CHEST 10/22/15 @ [facility], PET 10/30/15, EBUS 11/02/15 "SUSPICIOUS FOR MALIGNANACY"..NO PFT'S    HPI: Mrs. Sherren Mocha is sent for consultation regarding a right hilar mass and mediastinal adenopathy.  Mrs. Perret is a 70 year old woman with a history of tobacco abuse, diabetes, hypertension, hyperlipidemia, arthritis, and stroke in 2015. A few weeks ago she developed a productive cough with yellow mucus and general malaise. She did not have any fevers chills or sweats. She also has developed right-sided back pain for about the past month which is unrelieved with any medications. A chest x-ray showed a right hilar and mediastinal mass which was confirmed with a CT chest. A PET CT showed the 8 cm right hilar and mediastinal mass to have an SUV of 30.  She had a bronchoscopy and endobronchial ultrasound by Dr. Ashok Cordia on 11/02/2015. Endobronchial ultrasound showed atypical cells, but was nondiagnostic.  She denies chest pain, tightness or pressure. She had a stroke in 2015 but has no residual deficits and has not had any recent symptoms. She says she's lost about 3 pounds over the past 3 months. Her appetite is poor. She has not had any hemoptysis, headaches, visual changes. She does have the back pain as mentioned previously.  Zubrod Score: At the time of surgery this patient's most appropriate activity status/level should be described as: '[]'$     0    Normal activity, no symptoms '[x]'$     1    Restricted in physical strenuous activity but ambulatory, able to do out light work '[]'$     2    Ambulatory and capable of self care, unable to do work activities, up and about >50 % of waking hours                              '[]'$     3    Only limited self care, in bed greater than 50% of waking hours '[]'$     4    Completely disabled, no self care, confined to bed or chair '[]'$     5     Moribund   Past Medical History  Diagnosis Date  . Diabetes mellitus   . Hypertension   . Hyperlipidemia   . Arthritis   . Cataract   . Stroke ([place])   . Cholelithiases     Past Surgical History  Procedure Laterality Date  . Tubal ligation    . Abdominal hysterectomy    . Cholecystectomy    . Mandible surgery    . Appendectomy    . Bladder surgery    . Cataract extraction Bilateral   . Endobronchial ultrasound Bilateral 11/02/2015    Procedure: ENDOBRONCHIAL ULTRASOUND;  Surgeon: Javier Glazier, MD;  Location: [facility];  Service: Cardiopulmonary;  Laterality: Bilateral;    Family History  Problem Relation Age of Onset  . Diabetes Mother   . Heart disease Mother   . Breast cancer Mother   . Hyperlipidemia Mother   . Hypertension Mother   . Heart attack Mother   . Heart disease Father   . Heart attack Father   . Hypertension Son   . Breast cancer Maternal Aunt   . Lung disease Neg Hx     Social History Social History  Substance Use Topics  . Smoking status: Current Every Day Smoker --  1.00 packs/day for 50 years    Types: Cigarettes    Start date: 05/09/1965  . Smokeless tobacco: Current User    Types: Snuff     Comment: Peak rate of 1ppd  . Alcohol Use: No    Current Outpatient Prescriptions  Medication Sig Dispense Refill  . amLODipine (NORVASC) 5 MG tablet Take 5 mg by mouth daily.    Marland Kitchen aspirin 81 MG tablet Take 162 mg by mouth daily.     Marland Kitchen atorvastatin (LIPITOR) 40 MG tablet Take 40 mg by mouth daily.    . citalopram (CELEXA) 40 MG tablet Take 40 mg by mouth daily.    . fenofibrate 160 MG tablet Take 160 mg by mouth daily.    Marland Kitchen glimepiride (AMARYL) 1 MG tablet Take 0.5 mg by mouth daily with breakfast.    . hydrALAZINE (APRESOLINE) 25 MG tablet Take 25 mg by mouth 3 (three) times daily.    Marland Kitchen HYDROcodone-acetaminophen (NORCO/VICODIN) 5-325 MG tablet     . Insulin Glargine (LANTUS SOLOSTAR) 100 UNIT/ML Solostar Pen Inject 60 Units into the skin 2  (two) times daily.    Marland Kitchen ketorolac (TORADOL) 10 MG tablet Take 1 tablet (10 mg total) by mouth every 4 (four) hours. 30 tablet 0  . lisinopril (PRINIVIL,ZESTRIL) 20 MG tablet Take 20 mg by mouth 2 (two) times daily.    . metFORMIN (GLUCOPHAGE) 1000 MG tablet Take 1,000 mg by mouth 2 (two) times daily.    . Multiple Vitamins-Minerals (ZINC PO) Take 1 tablet by mouth daily.    Marland Kitchen spironolactone (ALDACTONE) 25 MG tablet Take 25 mg by mouth daily.     No current facility-administered medications for this visit.    Allergies  Allergen Reactions  . Amitriptyline Itching  . Flexeril [Cyclobenzaprine Hcl] Itching    Review of Systems  Constitutional: Positive for appetite change and unexpected weight change (Has lost 3 pounds in 3 months). Negative for fever and chills.  HENT: Positive for dental problem (Dentures). Negative for trouble swallowing and voice change.   Respiratory: Positive for cough and wheezing. Negative for shortness of breath.   Cardiovascular: Negative for chest pain and leg swelling.  Gastrointestinal: Negative for abdominal pain and blood in stool.  Genitourinary: Negative for hematuria and difficulty urinating.  Musculoskeletal: Positive for back pain, joint swelling and arthralgias.  Neurological: Negative for syncope, weakness and headaches.       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 No residual defects.  Hematological: Negative for adenopathy. Does not bruise/bleed easily.  Psychiatric/Behavioral: Positive for dysphoric mood.    BP 138/74 mmHg  Pulse 80  Resp 16  Ht '5\' 4"'$  (1.626 m)  Wt 134 lb (60.782 kg)  BMI 22.99 kg/m2  SpO2 96% Physical Exam  Constitutional: She is oriented to person, place, and time. She appears well-developed and well-nourished. No distress.  HENT:  Head: Normocephalic and atraumatic.  Mouth/Throat: No oropharyngeal exudate.  Eyes: Conjunctivae and EOM are normal. No scleral icterus.  Neck: Neck supple.  No thyromegaly present.  Left carotid bruit  Cardiovascular: Normal rate and regular rhythm.   Murmur (2/6 systolic) heard. Pulmonary/Chest: Effort normal. No respiratory distress. She has no wheezes. She has no rales.  Coarse breath sounds bilaterally  Abdominal: Soft. She exhibits no distension. There is no tenderness.  Musculoskeletal: She exhibits no edema.  Lymphadenopathy:    She has no cervical adenopathy.  Neurological: She is alert and oriented to person, place, and time. No cranial  nerve deficit. She exhibits normal muscle tone.  Skin: Skin is warm and dry.  Vitals reviewed.    Diagnostic Tests: NUCLEAR MEDICINE PET SKULL BASE TO THIGH  TECHNIQUE: 6.7 mCi F-18 FDG was injected intravenously. Full-ring PET imaging was performed from the skull base to thigh after the radiotracer. CT data was obtained and used for attenuation correction and anatomic localization.  FASTING BLOOD GLUCOSE: Value: 180 mg/dl  COMPARISON: Chest CT 10/22/2015  FINDINGS: NECK  Small right-sided supraclavicular lymph nodes are noted. There is a 7 mm node on image number 33 and a 9 mm node on image number 29. These are not hypermetabolic. No neck mass.  CHEST  Large right hilar and infrahilar mass is markedly hypermetabolic with SUV max of 25.0. This is consistent with a central lung neoplasm invading the right hilum and mediastinum, particularly these subcarinal space. No contralateral hilar adenopathy. There is a 7 mm pretracheal node which is mildly hypermetabolic with SUV max of 3.0.  Area of high attenuation centrally could be a small area of hemorrhage/hematoma.  As demonstrated on the prior CT scan there are advanced emphysematous changes but no findings for metastatic pulmonary nodules.  There is a small right pleural effusion which demonstrates mild hypermetabolism with SUV max of 3.5. This is worrisome for a malignant pleural effusion.  ABDOMEN/PELVIS  No  abnormal hypermetabolic activity within the liver, pancreas, adrenal glands, or spleen. No hypermetabolic lymph nodes in the abdomen or pelvis.  The advanced atherosclerotic calcifications involving the aorta, branch vessels and iliac arteries. No focal aneurysm.  SKELETON  No focal hypermetabolic activity to suggest skeletal metastasis.  IMPRESSION: 1. 8 cm central right lung mass invading the right hilum and mediastinum is markedly hypermetabolic and consistent with neoplasm. 2. Borderline enlarged right supraclavicular nodes but no hypermetabolism. 3. Suspect small malignant right pleural effusion. 4. No findings for metastatic disease involving the lungs, abdomen/pelvis or bony structures.   Electronically Signed  By: Marijo Sanes M.D.  On: 10/30/2015 10:54  I personally reviewed the PET/CT and also the CT from [facility]. I concur with the findings as noted above.  Impression: 70 year old woman with a history of tobacco abuse who has a large right hilar and mediastinal mass. 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 She did have atypical cells with some degree of suspicion for possible lymphoma on her endobronchial ultrasound so that is also in the differential. A non-small cell carcinoma is also a possibility.  In any event she needs a diagnosis to guide appropriate therapy. She has had bronchoscopy and endobronchial ultrasound which was nondiagnostic. I think the next step is mediastinal endoscopy. 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  I described the proposed procedure to Mrs. Mcgraw and her daughter in detail. We discussed the general nature of the procedure, the need for general anesthesia, the incision to be used, and the outpatient nature of the  procedure. I reviewed the complications, risks, benefits, and alternatives. She understands the risks include, but are not limited to death, MI, stroke, DVT, PE, bleeding, possible need for transfusion, infection, pneumothorax, recurrent nerve injury leading to vocal cord paralysis, failure to make a diagnosis, as well as the possibility of other unforeseeable complications.  She accepts the risk and wishes to proceed.  Plan: Mediastinoscopy on Monday, 11/23/2015  Melrose Nakayama,  MD [facility] [phone]

## 2015-11-17 ENCOUNTER — Other Ambulatory Visit: Payer: Self-pay | Admitting: *Deleted

## 2015-11-17 DIAGNOSIS — R590 Localized enlarged lymph nodes: Secondary | ICD-10-CM

## 2015-11-18 ENCOUNTER — Other Ambulatory Visit (HOSPITAL_COMMUNITY): Payer: Self-pay | Admitting: *Deleted

## 2015-11-18 NOTE — Pre-Procedure Instructions (Signed)
Katherine Bullock  11/18/2015      [facility] - [place], [place] [address] Phone: [phone] Fax: [phone]    Your procedure is scheduled on Monday, Nov 23, 2015 at 7:30 AM.   Report to [facility] Entrance "A" Admitting Office at 5:30 AM.   Call this number if you have problems the morning of surgery: [phone]   Remember:  Do not eat food or drink liquids after midnight Sunday, 11/22/15.  Take these medicines the morning of surgery with A SIP OF WATER: Amlodipine (Norvasc), Citalopram (Celexa), Hydralazine (Apresoline), Hydrocodone - if needed  Stop Multivitamins and NSAIDS (Ketorolac, Toradol, Ibuprofen, Aleve, etc.) as of today.    How to Manage Your Diabetes Before Surgery   Why is it important to control my blood sugar before and after surgery?   Improving blood sugar levels before and after surgery helps healing and can limit problems.  A way of improving blood sugar control is eating a healthy diet by:  - Eating less sugar and carbohydrates  - Increasing activity/exercise  - Talk with your doctor about reaching your blood sugar goals  High blood sugars (greater than 180 mg/dL) can raise your risk of infections and slow down your recovery so you will need to focus on controlling your diabetes during the weeks before surgery.  Make sure that the doctor who takes care of your diabetes knows about your planned surgery including the date and location.  How do I manage my blood sugars before surgery?   Check your blood sugar at least 4 times a day, 2 days before surgery to make sure that they are not too high or low.  Check your blood sugar the morning of your surgery when you wake up and every 2 hours until you get to the Short-Stay unit.  Treat a low blood sugar (less than 70 mg/dL) with 1/2 cup of clear juice (cranberry or apple), 4 glucose tablets, OR glucose gel.  Recheck blood sugar in 15  minutes after treatment (to make sure it is greater than 70 mg/dL).  If blood sugar is not greater than 70 mg/dL on re-check, call [phone] for further instructions.   Report your blood sugar to the Short-Stay nurse when you get to Short-Stay.  References:  [facility], 2007 "How to Manage your Diabetes Before and After Surgery".  What do I do about my diabetes medications?   Do not take oral diabetes medicines (pills) the morning of surgery.  THE NIGHT BEFORE SURGERY, take 30 units of Lantus Insulin.    THE MORNING OF SURGERY, take 30 units of Lantus Insulin.   Do not wear jewelry, make-up or nail polish.  Do not wear lotions, powders, or perfumes.  You may wear deoderant.  Do not shave 48 hours prior to surgery.  Men may shave face and neck.  Do not bring valuables to the hospital.  [facility] is not responsible for any belongings or valuables.  Contacts, dentures or bridgework may not be worn into surgery.  Leave your suitcase in the car.  After surgery it may be brought to your room.  For patients admitted to the hospital, discharge time will be determined by your treatment team.  Special instructions: See "Preparing for Surgery" Instruction sheet.  Please read over the following fact sheets that you were given. Pain Booklet, Coughing and Deep Breathing, MRSA Information and Surgical Site Infection  Prevention

## 2015-11-19 ENCOUNTER — Encounter (HOSPITAL_COMMUNITY): Payer: Self-pay

## 2015-11-19 ENCOUNTER — Encounter (HOSPITAL_COMMUNITY)
Admission: RE | Admit: 2015-11-19 | Discharge: 2015-11-19 | Disposition: A | Discharge status: Home / Self Care | Payer: PPO | Source: Ambulatory Visit | Attending: Thoracic Surgery (Cardiothoracic Vascular Surgery) | Admitting: Thoracic Surgery (Cardiothoracic Vascular Surgery)

## 2015-11-19 VITALS — BP 173/66 | HR 76 | Temp 97.7°F | Resp 18 | Ht 64.0 in | Wt 129.1 lb

## 2015-11-19 DIAGNOSIS — Z01812 Encounter for preprocedural laboratory examination: Secondary | ICD-10-CM | POA: Insufficient documentation

## 2015-11-19 DIAGNOSIS — Z01810 Encounter for preprocedural cardiovascular examination: Secondary | ICD-10-CM | POA: Insufficient documentation

## 2015-11-19 DIAGNOSIS — R590 Localized enlarged lymph nodes: Secondary | ICD-10-CM

## 2015-11-19 HISTORY — DX: Depression, unspecified: F32.A

## 2015-11-19 HISTORY — DX: Anxiety disorder, unspecified: F41.9

## 2015-11-19 HISTORY — DX: Major depressive disorder, single episode, unspecified: F32.9

## 2015-11-19 HISTORY — DX: Chronic obstructive pulmonary disease, unspecified: J44.9

## 2015-11-19 LAB — TYPE AND SCREEN
ABO/RH(D): O POS
Antibody Screen: NEGATIVE

## 2015-11-19 LAB — COMPREHENSIVE METABOLIC PANEL
ALK PHOS: 92 U/L (ref 38–126)
ALT: 17 U/L (ref 14–54)
AST: 23 U/L (ref 15–41)
Albumin: 3.9 g/dL (ref 3.5–5.0)
Anion gap: 12 (ref 5–15)
BUN: 9 mg/dL (ref 6–20)
CALCIUM: 9.8 mg/dL (ref 8.9–10.3)
CO2: 24 mmol/L (ref 22–32)
CREATININE: 1.08 mg/dL — AB (ref 0.44–1.00)
Chloride: 100 mmol/L — ABNORMAL LOW (ref 101–111)
GFR, EST AFRICAN AMERICAN: 59 mL/min — AB (ref >60)
GFR, EST NON AFRICAN AMERICAN: 51 mL/min — AB (ref >60)
GLUCOSE: 136 mg/dL — AB (ref 65–99)
Potassium: 4.6 mmol/L (ref 3.5–5.1)
Sodium: 136 mmol/L (ref 135–145)
TOTAL PROTEIN: 7.6 g/dL (ref 6.5–8.1)
Total Bilirubin: 0.6 mg/dL (ref 0.3–1.2)

## 2015-11-19 LAB — PROTIME-INR
INR: 1.15 (ref 0.00–1.49)
Prothrombin Time: 14.9 seconds (ref 11.6–15.2)

## 2015-11-19 LAB — SURGICAL PCR SCREEN
MRSA, PCR: NEGATIVE
Staphylococcus aureus: NEGATIVE

## 2015-11-19 LAB — CBC
HCT: 41.9 % (ref 36.0–46.0)
Hemoglobin: 13.8 g/dL (ref 12.0–15.0)
MCH: 30.8 pg (ref 26.0–34.0)
MCHC: 32.9 g/dL (ref 30.0–36.0)
MCV: 93.5 fL (ref 78.0–100.0)
PLATELETS: 393 10*3/uL (ref 150–400)
RBC: 4.48 MIL/uL (ref 3.87–5.11)
RDW: 12.7 % (ref 11.5–15.5)
WBC: 11.5 10*3/uL — ABNORMAL HIGH (ref 4.0–10.5)

## 2015-11-19 LAB — APTT: aPTT: 29 seconds (ref 24–37)

## 2015-11-19 LAB — ABO/RH: ABO/RH(D): O POS

## 2015-11-19 LAB — GLUCOSE, CAPILLARY: Glucose-Capillary: 152 mg/dL — ABNORMAL HIGH (ref 65–99)

## 2015-11-19 NOTE — Telephone Encounter (Signed)
This chest xray was already done. Xray was done on 11/06/2015. Nothing further was needed.

## 2015-11-20 MED ORDER — DEXTROSE 5 % IV SOLN
1.5000 g | INTRAVENOUS | Status: AC
  Administered 2015-11-23: 1.5 g via INTRAVENOUS
  Filled 2015-11-20: qty 1.5

## 2015-11-21 LAB — HEMOGLOBIN A1C
Hgb A1c MFr Bld: 7.4 % — ABNORMAL HIGH (ref 4.8–5.6)
MEAN PLASMA GLUCOSE: 166 mg/dL

## 2015-11-23 ENCOUNTER — Ambulatory Visit (HOSPITAL_COMMUNITY): Payer: PPO | Admitting: Certified Registered Nurse Anesthetist

## 2015-11-23 ENCOUNTER — Encounter (HOSPITAL_COMMUNITY)
Admission: RE | Disposition: A | Discharge status: Home / Self Care | Payer: Self-pay | Source: Ambulatory Visit | Attending: Thoracic Surgery (Cardiothoracic Vascular Surgery)

## 2015-11-23 ENCOUNTER — Encounter (HOSPITAL_COMMUNITY): Payer: Self-pay | Admitting: *Deleted

## 2015-11-23 ENCOUNTER — Ambulatory Visit (HOSPITAL_COMMUNITY)
Admission: RE | Admit: 2015-11-23 | Discharge: 2015-11-23 | Disposition: A | Discharge status: Home / Self Care | Payer: PPO | Source: Ambulatory Visit | Attending: Thoracic Surgery (Cardiothoracic Vascular Surgery) | Admitting: Thoracic Surgery (Cardiothoracic Vascular Surgery)

## 2015-11-23 DIAGNOSIS — F17210 Nicotine dependence, cigarettes, uncomplicated: Secondary | ICD-10-CM | POA: Insufficient documentation

## 2015-11-23 DIAGNOSIS — J449 Chronic obstructive pulmonary disease, unspecified: Secondary | ICD-10-CM | POA: Diagnosis not present

## 2015-11-23 DIAGNOSIS — C383 Malignant neoplasm of mediastinum, part unspecified: Secondary | ICD-10-CM | POA: Insufficient documentation

## 2015-11-23 DIAGNOSIS — E119 Type 2 diabetes mellitus without complications: Secondary | ICD-10-CM | POA: Diagnosis not present

## 2015-11-23 DIAGNOSIS — M1990 Unspecified osteoarthritis, unspecified site: Secondary | ICD-10-CM | POA: Insufficient documentation

## 2015-11-23 DIAGNOSIS — R590 Localized enlarged lymph nodes: Secondary | ICD-10-CM

## 2015-11-23 DIAGNOSIS — R599 Enlarged lymph nodes, unspecified: Secondary | ICD-10-CM | POA: Diagnosis present

## 2015-11-23 DIAGNOSIS — Z8673 Personal history of transient ischemic attack (TIA), and cerebral infarction without residual deficits: Secondary | ICD-10-CM | POA: Diagnosis not present

## 2015-11-23 DIAGNOSIS — Z7982 Long term (current) use of aspirin: Secondary | ICD-10-CM | POA: Insufficient documentation

## 2015-11-23 DIAGNOSIS — I10 Essential (primary) hypertension: Secondary | ICD-10-CM | POA: Insufficient documentation

## 2015-11-23 DIAGNOSIS — Z794 Long term (current) use of insulin: Secondary | ICD-10-CM | POA: Diagnosis not present

## 2015-11-23 DIAGNOSIS — E785 Hyperlipidemia, unspecified: Secondary | ICD-10-CM | POA: Diagnosis not present

## 2015-11-23 HISTORY — PX: MEDIASTINOSCOPY: SHX5086

## 2015-11-23 LAB — GLUCOSE, CAPILLARY
GLUCOSE-CAPILLARY: 167 mg/dL — AB (ref 65–99)
Glucose-Capillary: 235 mg/dL — ABNORMAL HIGH (ref 65–99)

## 2015-11-23 SURGERY — MEDIASTINOSCOPY
Anesthesia: General | Site: Chest

## 2015-11-23 MED ORDER — DEXTROSE 5 % IV SOLN
10.0000 mg | INTRAVENOUS | Status: DC | PRN
  Administered 2015-11-23: 25 ug/min via INTRAVENOUS

## 2015-11-23 MED ORDER — ONDANSETRON HCL 4 MG/2ML IJ SOLN
INTRAMUSCULAR | Status: DC | PRN
  Administered 2015-11-23: 4 mg via INTRAVENOUS

## 2015-11-23 MED ORDER — GLYCOPYRROLATE 0.2 MG/ML IJ SOLN
INTRAMUSCULAR | Status: DC | PRN
  Administered 2015-11-23: 0.6 mg via INTRAVENOUS

## 2015-11-23 MED ORDER — FENTANYL CITRATE (PF) 100 MCG/2ML IJ SOLN
INTRAMUSCULAR | Status: AC
  Administered 2015-11-23: 100 ug via INTRAVENOUS
  Filled 2015-11-23: qty 2

## 2015-11-23 MED ORDER — FENTANYL CITRATE (PF) 250 MCG/5ML IJ SOLN
INTRAMUSCULAR | Status: DC | PRN
  Administered 2015-11-23: 50 ug via INTRAVENOUS
  Administered 2015-11-23: 150 ug via INTRAVENOUS
  Administered 2015-11-23: 50 ug via INTRAVENOUS

## 2015-11-23 MED ORDER — FENTANYL CITRATE (PF) 100 MCG/2ML IJ SOLN
100.0000 ug | Freq: Once | INTRAMUSCULAR | Status: AC
  Administered 2015-11-23: 100 ug via INTRAVENOUS

## 2015-11-23 MED ORDER — ROCURONIUM BROMIDE 100 MG/10ML IV SOLN
INTRAVENOUS | Status: DC | PRN
  Administered 2015-11-23: 40 mg via INTRAVENOUS

## 2015-11-23 MED ORDER — NEOSTIGMINE METHYLSULFATE 10 MG/10ML IV SOLN
INTRAVENOUS | Status: DC | PRN
  Administered 2015-11-23: 4 mg via INTRAVENOUS

## 2015-11-23 MED ORDER — FENTANYL CITRATE (PF) 250 MCG/5ML IJ SOLN
INTRAMUSCULAR | Status: AC
  Filled 2015-11-23: qty 5

## 2015-11-23 MED ORDER — 0.9 % SODIUM CHLORIDE (POUR BTL) OPTIME
TOPICAL | Status: DC | PRN
  Administered 2015-11-23: 1000 mL

## 2015-11-23 MED ORDER — MIDAZOLAM HCL 5 MG/5ML IJ SOLN
INTRAMUSCULAR | Status: DC | PRN
  Administered 2015-11-23: 1 mg via INTRAVENOUS

## 2015-11-23 MED ORDER — PROMETHAZINE HCL 25 MG/ML IJ SOLN: 6.2500 mg | INTRAMUSCULAR | Status: DC | PRN

## 2015-11-23 MED ORDER — LIDOCAINE 2% (20 MG/ML) 5 ML SYRINGE
INTRAMUSCULAR | Status: AC
  Filled 2015-11-23: qty 5

## 2015-11-23 MED ORDER — HYDROMORPHONE HCL 1 MG/ML IJ SOLN: 0.2500 mg | INTRAMUSCULAR | Status: DC | PRN

## 2015-11-23 MED ORDER — MIDAZOLAM HCL 2 MG/2ML IJ SOLN
INTRAMUSCULAR | Status: AC
  Filled 2015-11-23: qty 2

## 2015-11-23 MED ORDER — LACTATED RINGERS IV SOLN
INTRAVENOUS | Status: DC
  Administered 2015-11-23 (×2): via INTRAVENOUS

## 2015-11-23 MED ORDER — LIDOCAINE HCL (CARDIAC) 20 MG/ML IV SOLN
INTRAVENOUS | Status: DC | PRN
  Administered 2015-11-23: 60 mg via INTRAVENOUS

## 2015-11-23 MED ORDER — HYDROMORPHONE HCL 2 MG PO TABS: 1.0000 mg | ORAL_TABLET | Freq: Four times a day (QID) | ORAL | Status: DC | PRN

## 2015-11-23 MED ORDER — PROPOFOL 10 MG/ML IV BOLUS
INTRAVENOUS | Status: DC | PRN
  Administered 2015-11-23: 50 mg via INTRAVENOUS
  Administered 2015-11-23: 110 mg via INTRAVENOUS

## 2015-11-23 SURGICAL SUPPLY — 45 items
APPLIER CLIP LOGIC TI 5 (MISCELLANEOUS) IMPLANT
BLADE SURG 15 STRL LF DISP TIS (BLADE) ×1 IMPLANT
BLADE SURG 15 STRL SS (BLADE) ×2
CANISTER SUCTION 2500CC (MISCELLANEOUS) ×3 IMPLANT
CLIP TI MEDIUM 6 (CLIP) IMPLANT
CONT SPEC 4OZ CLIKSEAL STRL BL (MISCELLANEOUS) ×9 IMPLANT
COVER SURGICAL LIGHT HANDLE (MISCELLANEOUS) ×6 IMPLANT
DERMABOND ADVANCED (GAUZE/BANDAGES/DRESSINGS) ×2
DERMABOND ADVANCED .7 DNX12 (GAUZE/BANDAGES/DRESSINGS) ×1 IMPLANT
DRAPE CHEST BREAST 15X10 FENES (DRAPES) ×3 IMPLANT
ELECT CAUTERY BLADE 6.4 (BLADE) ×3 IMPLANT
ELECT REM PT RETURN 9FT ADLT (ELECTROSURGICAL) ×3
ELECTRODE REM PT RTRN 9FT ADLT (ELECTROSURGICAL) ×1 IMPLANT
GAUZE SPONGE 4X4 12PLY STRL (GAUZE/BANDAGES/DRESSINGS) ×3 IMPLANT
GAUZE SPONGE 4X4 16PLY XRAY LF (GAUZE/BANDAGES/DRESSINGS) ×3 IMPLANT
GLOVE SURG SIGNA 7.5 PF LTX (GLOVE) ×3 IMPLANT
GOWN STRL REUS W/ TWL LRG LVL3 (GOWN DISPOSABLE) ×1 IMPLANT
GOWN STRL REUS W/ TWL XL LVL3 (GOWN DISPOSABLE) ×1 IMPLANT
GOWN STRL REUS W/TWL LRG LVL3 (GOWN DISPOSABLE) ×2
GOWN STRL REUS W/TWL XL LVL3 (GOWN DISPOSABLE) ×2
HEMOSTAT SURGICEL 2X14 (HEMOSTASIS) IMPLANT
KIT BASIN OR (CUSTOM PROCEDURE TRAY) ×3 IMPLANT
KIT ROOM TURNOVER OR (KITS) ×3 IMPLANT
LIQUID BAND (GAUZE/BANDAGES/DRESSINGS) ×3 IMPLANT
NS IRRIG 1000ML POUR BTL (IV SOLUTION) ×3 IMPLANT
PACK SURGICAL SETUP 50X90 (CUSTOM PROCEDURE TRAY) ×3 IMPLANT
PAD ARMBOARD 7.5X6 YLW CONV (MISCELLANEOUS) ×6 IMPLANT
PENCIL BUTTON HOLSTER BLD 10FT (ELECTRODE) ×3 IMPLANT
SPONGE INTESTINAL PEANUT (DISPOSABLE) IMPLANT
SUT SILK 2 0 (SUTURE)
SUT SILK 2-0 18XBRD TIE 12 (SUTURE) IMPLANT
SUT VIC AB 2-0 CT1 27 (SUTURE)
SUT VIC AB 2-0 CT1 TAPERPNT 27 (SUTURE) IMPLANT
SUT VIC AB 3-0 SH 18 (SUTURE) ×3 IMPLANT
SUT VICRYL 4-0 PS2 18IN ABS (SUTURE) ×3 IMPLANT
SWAB COLLECTION DEVICE MRSA (MISCELLANEOUS) IMPLANT
SYR BULB IRRIGATION 50ML (SYRINGE) ×3 IMPLANT
SYRINGE 10CC LL (SYRINGE) ×3 IMPLANT
TOWEL OR 17X24 6PK STRL BLUE (TOWEL DISPOSABLE) ×3 IMPLANT
TOWEL OR 17X26 10 PK STRL BLUE (TOWEL DISPOSABLE) ×3 IMPLANT
TRAP SPECIMEN MUCOUS 40CC (MISCELLANEOUS) ×3 IMPLANT
TUBE ANAEROBIC SPECIMEN COL (MISCELLANEOUS) IMPLANT
TUBE CONNECTING 12'X1/4 (SUCTIONS) ×1
TUBE CONNECTING 12X1/4 (SUCTIONS) ×2 IMPLANT
WATER STERILE IRR 1000ML POUR (IV SOLUTION) ×3 IMPLANT

## 2015-11-23 NOTE — Anesthesia Preprocedure Evaluation (Addendum)
Anesthesia Evaluation  Patient identified by MRN, date of birth, ID band Patient awake    Reviewed: Allergy & Precautions, NPO status , Patient's Chart, lab work & pertinent test results  Airway Mallampati: II  TM Distance: >3 FB Neck ROM: Full    Dental no notable dental hx. (+) Edentulous Upper, Edentulous Lower, Dental Advisory Given   Pulmonary COPD, Current Smoker,    Pulmonary exam normal breath sounds clear to auscultation       Cardiovascular hypertension, Pt. on medications Normal cardiovascular exam Rhythm:Regular Rate:Normal     Neuro/Psych Anxiety Depression CVA negative neurological ROS     GI/Hepatic negative GI ROS, Neg liver ROS,   Endo/Other  diabetes, Type 2, Insulin Dependent  Renal/GU negative Renal ROS  negative genitourinary   Musculoskeletal negative musculoskeletal ROS (+) Arthritis , Osteoarthritis,    Abdominal   Peds negative pediatric ROS (+)  Hematology negative hematology ROS (+)   Anesthesia Other Findings   Reproductive/Obstetrics negative OB ROS                            Anesthesia Physical Anesthesia Plan  ASA: III  Anesthesia Plan: General   Post-op Pain Management:    Induction: Intravenous  Airway Management Planned: Oral ETT  Additional Equipment: Arterial line  Intra-op Plan:   Post-operative Plan: Extubation in OR  Informed Consent: I have reviewed the patients History and Physical, chart, labs and discussed the procedure including the risks, benefits and alternatives for the proposed anesthesia with the patient or authorized representative who has indicated his/her understanding and acceptance.   Dental advisory given  Plan Discussed with: CRNA, Surgeon and Anesthesiologist  Anesthesia Plan Comments:        Anesthesia Quick Evaluation

## 2015-11-23 NOTE — Transfer of Care (Signed)
Immediate Anesthesia Transfer of Care Note  Patient: Katherine Bullock  Procedure(s) Performed: Procedure(s): MEDIASTINOSCOPY (N/A)  Patient Location: PACU  Anesthesia Type:General  Level of Consciousness: awake, alert , oriented and patient cooperative  Airway & Oxygen Therapy: Patient Spontanous Breathing and Patient connected to nasal cannula oxygen  Post-op Assessment: Report given to RN and Post -op Vital signs reviewed and stable  Post vital signs: Reviewed and stable  Last Vitals:  Filed Vitals:   11/23/15 1144  BP: 145/72  Pulse: 83  Temp: 36.5 C  Resp: 18    Last Pain:  Filed Vitals:   11/23/15 1334  PainSc: 10-Worst pain ever         Complications: No apparent anesthesia complications

## 2015-11-23 NOTE — H&P (View-Only) (Signed)
PCP is HODGES,BETH, MD Referring Provider is Javier Glazier, MD  Chief Complaint  Patient presents with  . Lung Mass    RIGHT..CT CHEST 10/22/15 @ [facility], PET 10/30/15, EBUS 11/02/15 "SUSPICIOUS FOR MALIGNANACY"..NO PFT'S    HPI: Mrs. Katherine Bullock is sent for consultation regarding a right hilar mass and mediastinal adenopathy.  Mrs. Katherine Bullock is a 70 year old woman with a history of tobacco abuse, diabetes, hypertension, hyperlipidemia, arthritis, and stroke in 2015. A few weeks ago she developed a productive cough with yellow mucus and general malaise. She did not have any fevers chills or sweats. She also has developed right-sided back pain for about the past month which is unrelieved with any medications. A chest x-ray showed a right hilar and mediastinal mass which was confirmed with a CT chest. A PET CT showed the 8 cm right hilar and mediastinal mass to have an SUV of 30.  She had a bronchoscopy and endobronchial ultrasound by Dr. Ashok Cordia on 11/02/2015. Endobronchial ultrasound showed atypical cells, but was nondiagnostic.  She denies chest pain, tightness or pressure. She had a stroke in 2015 but has no residual deficits and has not had any recent symptoms. She says she's lost about 3 pounds over the past 3 months. Her appetite is poor. She has not had any hemoptysis, headaches, visual changes. She does have the back pain as mentioned previously.  Zubrod Score: At the time of surgery this patient's most appropriate activity status/level should be described as: '[]'$     0    Normal activity, no symptoms '[x]'$     1    Restricted in physical strenuous activity but ambulatory, able to do out light work '[]'$     2    Ambulatory and capable of self care, unable to do work activities, up and about >50 % of waking hours                              '[]'$     3    Only limited self care, in bed greater than 50% of waking hours '[]'$     4    Completely disabled, no self care, confined to bed or chair '[]'$     5     Moribund   Past Medical History  Diagnosis Date  . Diabetes mellitus   . Hypertension   . Hyperlipidemia   . Arthritis   . Cataract   . Stroke ([place])   . Cholelithiases     Past Surgical History  Procedure Laterality Date  . Tubal ligation    . Abdominal hysterectomy    . Cholecystectomy    . Mandible surgery    . Appendectomy    . Bladder surgery    . Cataract extraction Bilateral   . Endobronchial ultrasound Bilateral 11/02/2015    Procedure: ENDOBRONCHIAL ULTRASOUND;  Surgeon: Javier Glazier, MD;  Location: [facility];  Service: Cardiopulmonary;  Laterality: Bilateral;    Family History  Problem Relation Age of Onset  . Diabetes Mother   . Heart disease Mother   . Breast cancer Mother   . Hyperlipidemia Mother   . Hypertension Mother   . Heart attack Mother   . Heart disease Father   . Heart attack Father   . Hypertension Son   . Breast cancer Maternal Aunt   . Lung disease Neg Hx     Social History Social History  Substance Use Topics  . Smoking status: Current Every Day Smoker --  1.00 packs/day for 50 years    Types: Cigarettes    Start date: 05/09/1965  . Smokeless tobacco: Current User    Types: Snuff     Comment: Peak rate of 1ppd  . Alcohol Use: No    Current Outpatient Prescriptions  Medication Sig Dispense Refill  . amLODipine (NORVASC) 5 MG tablet Take 5 mg by mouth daily.    Marland Kitchen aspirin 81 MG tablet Take 162 mg by mouth daily.     Marland Kitchen atorvastatin (LIPITOR) 40 MG tablet Take 40 mg by mouth daily.    . citalopram (CELEXA) 40 MG tablet Take 40 mg by mouth daily.    . fenofibrate 160 MG tablet Take 160 mg by mouth daily.    Marland Kitchen glimepiride (AMARYL) 1 MG tablet Take 0.5 mg by mouth daily with breakfast.    . hydrALAZINE (APRESOLINE) 25 MG tablet Take 25 mg by mouth 3 (three) times daily.    Marland Kitchen HYDROcodone-acetaminophen (NORCO/VICODIN) 5-325 MG tablet     . Insulin Glargine (LANTUS SOLOSTAR) 100 UNIT/ML Solostar Pen Inject 60 Units into the skin 2  (two) times daily.    Marland Kitchen ketorolac (TORADOL) 10 MG tablet Take 1 tablet (10 mg total) by mouth every 4 (four) hours. 30 tablet 0  . lisinopril (PRINIVIL,ZESTRIL) 20 MG tablet Take 20 mg by mouth 2 (two) times daily.    . metFORMIN (GLUCOPHAGE) 1000 MG tablet Take 1,000 mg by mouth 2 (two) times daily.    . Multiple Vitamins-Minerals (ZINC PO) Take 1 tablet by mouth daily.    Marland Kitchen spironolactone (ALDACTONE) 25 MG tablet Take 25 mg by mouth daily.     No current facility-administered medications for this visit.    Allergies  Allergen Reactions  . Amitriptyline Itching  . Flexeril [Cyclobenzaprine Hcl] Itching    Review of Systems  Constitutional: Positive for appetite change and unexpected weight change (Has lost 3 pounds in 3 months). Negative for fever and chills.  HENT: Positive for dental problem (Dentures). Negative for trouble swallowing and voice change.   Respiratory: Positive for cough and wheezing. Negative for shortness of breath.   Cardiovascular: Negative for chest pain and leg swelling.  Gastrointestinal: Negative for abdominal pain and blood in stool.  Genitourinary: Negative for hematuria and difficulty urinating.  Musculoskeletal: Positive for back pain, joint swelling and arthralgias.  Neurological: Negative for syncope, weakness and headaches.       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 No residual defects.  Hematological: Negative for adenopathy. Does not bruise/bleed easily.  Psychiatric/Behavioral: Positive for dysphoric mood.    BP 138/74 mmHg  Pulse 80  Resp 16  Ht '5\' 4"'$  (1.626 m)  Wt 134 lb (60.782 kg)  BMI 22.99 kg/m2  SpO2 96% Physical Exam  Constitutional: She is oriented to person, place, and time. She appears well-developed and well-nourished. No distress.  HENT:  Head: Normocephalic and atraumatic.  Mouth/Throat: No oropharyngeal exudate.  Eyes: Conjunctivae and EOM are normal. No scleral icterus.  Neck: Neck supple.  No thyromegaly present.  Left carotid bruit  Cardiovascular: Normal rate and regular rhythm.   Murmur (2/6 systolic) heard. Pulmonary/Chest: Effort normal. No respiratory distress. She has no wheezes. She has no rales.  Coarse breath sounds bilaterally  Abdominal: Soft. She exhibits no distension. There is no tenderness.  Musculoskeletal: She exhibits no edema.  Lymphadenopathy:    She has no cervical adenopathy.  Neurological: She is alert and oriented to person, place, and time. No cranial  nerve deficit. She exhibits normal muscle tone.  Skin: Skin is warm and dry.  Vitals reviewed.    Diagnostic Tests: NUCLEAR MEDICINE PET SKULL BASE TO THIGH  TECHNIQUE: 6.7 mCi F-18 FDG was injected intravenously. Full-ring PET imaging was performed from the skull base to thigh after the radiotracer. CT data was obtained and used for attenuation correction and anatomic localization.  FASTING BLOOD GLUCOSE: Value: 180 mg/dl  COMPARISON: Chest CT 10/22/2015  FINDINGS: NECK  Small right-sided supraclavicular lymph nodes are noted. There is a 7 mm node on image number 33 and a 9 mm node on image number 29. These are not hypermetabolic. No neck mass.  CHEST  Large right hilar and infrahilar mass is markedly hypermetabolic with SUV max of 70.3. This is consistent with a central lung neoplasm invading the right hilum and mediastinum, particularly these subcarinal space. No contralateral hilar adenopathy. There is a 7 mm pretracheal node which is mildly hypermetabolic with SUV max of 3.0.  Area of high attenuation centrally could be a small area of hemorrhage/hematoma.  As demonstrated on the prior CT scan there are advanced emphysematous changes but no findings for metastatic pulmonary nodules.  There is a small right pleural effusion which demonstrates mild hypermetabolism with SUV max of 3.5. This is worrisome for a malignant pleural effusion.  ABDOMEN/PELVIS  No  abnormal hypermetabolic activity within the liver, pancreas, adrenal glands, or spleen. No hypermetabolic lymph nodes in the abdomen or pelvis.  The advanced atherosclerotic calcifications involving the aorta, branch vessels and iliac arteries. No focal aneurysm.  SKELETON  No focal hypermetabolic activity to suggest skeletal metastasis.  IMPRESSION: 1. 8 cm central right lung mass invading the right hilum and mediastinum is markedly hypermetabolic and consistent with neoplasm. 2. Borderline enlarged right supraclavicular nodes but no hypermetabolism. 3. Suspect small malignant right pleural effusion. 4. No findings for metastatic disease involving the lungs, abdomen/pelvis or bony structures.   Electronically Signed  By: Marijo Sanes M.D.  On: 10/30/2015 10:54  I personally reviewed the PET/CT and also the CT from [facility]. I concur with the findings as noted above.  Impression: 70 year old woman with a history of tobacco abuse who has a large right hilar and mediastinal mass. 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 She did have atypical cells with some degree of suspicion for possible lymphoma on her endobronchial ultrasound so that is also in the differential. A non-small cell carcinoma is also a possibility.  In any event she needs a diagnosis to guide appropriate therapy. She has had bronchoscopy and endobronchial ultrasound which was nondiagnostic. I think the next step is mediastinal endoscopy. 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  I described the proposed procedure to Mrs. Katherine Bullock and her daughter in detail. We discussed the general nature of the procedure, the need for general anesthesia, the incision to be used, and the outpatient nature of the  procedure. I reviewed the complications, risks, benefits, and alternatives. She understands the risks include, but are not limited to death, MI, stroke, DVT, PE, bleeding, possible need for transfusion, infection, pneumothorax, recurrent nerve injury leading to vocal cord paralysis, failure to make a diagnosis, as well as the possibility of other unforeseeable complications.  She accepts the risk and wishes to proceed.  Plan: Mediastinoscopy on Monday, 11/23/2015  Melrose Nakayama,  MD [facility] [phone]

## 2015-11-23 NOTE — Anesthesia Procedure Notes (Signed)
Procedure Name: Intubation Date/Time: 11/23/2015 2:10 PM Performed by: Oletta Lamas Pre-anesthesia Checklist: Patient identified, Emergency Drugs available, Suction available and Patient being monitored Patient Re-evaluated:Patient Re-evaluated prior to inductionOxygen Delivery Method: Circle System Utilized Preoxygenation: Pre-oxygenation with 100% oxygen Intubation Type: IV induction Ventilation: Mask ventilation without difficulty Laryngoscope Size: Miller and 2 Grade View: Grade I Tube type: Oral Number of attempts: 1 Airway Equipment and Method: Stylet and Oral airway Placement Confirmation: ETT inserted through vocal cords under direct vision,  positive ETCO2 and breath sounds checked- equal and bilateral Secured at: 22 cm Tube secured with: Tape Dental Injury: Teeth and Oropharynx as per pre-operative assessment

## 2015-11-23 NOTE — Interval H&P Note (Signed)
History and Physical Interval Note:  11/23/2015 1:42 PM  Katherine Bullock  has presented today for surgery, with the diagnosis of MEDIASTINAL ADENOPATHY  The various methods of treatment have been discussed with the patient and family. After consideration of risks, benefits and other options for treatment, the patient has consented to  Procedure(s): MEDIASTINOSCOPY (N/A) as a surgical intervention .  The patient's history has been reviewed, patient examined, no change in status, stable for surgery.  I have reviewed the patient's chart and labs.  Questions were answered to the patient's satisfaction.     Melrose Nakayama

## 2015-11-23 NOTE — Discharge Instructions (Signed)
Do not drive or engage in heavy physical activity for the next 24 hours.  You may shower and resume normal activities tomorrow  You have a prescription for hydromorphone, a narcotic pain reliever, you may take 1-2 tablets every 6 hours as needed.  STOP TAKING hydrocodone and tramadol  Do not drive while you are taking the hydromorphone  Call [phone] if you develop chest pain, shortness of breath, fever > 101 F, or notice excessive swelling or drainage from the incision.  My office will contact you with follow up information.

## 2015-11-23 NOTE — Brief Op Note (Addendum)
11/23/2015  3:43 PM  PATIENT:  Katherine Bullock  70 y.o. female  PRE-OPERATIVE DIAGNOSIS:  MEDIASTINAL ADENOPATHY  POST-OPERATIVE DIAGNOSIS:  mediastinal adenopathy-   PROCEDURE:  Procedure(s): MEDIASTINOSCOPY (N/A)  SURGEON:  Surgeon(s) and Role:    * Melrose Nakayama, MD - Primary  ASSISTANTS: none   ANESTHESIA:   general  EBL:  Total I/O In: -  Out: 45 [Blood:50]  BLOOD ADMINISTERED:none  DRAINS: none   LOCAL MEDICATIONS USED:  NONE  SPECIMEN:  Source of Specimen:  level 7 node  DISPOSITION OF SPECIMEN:  PATHOLOGY  COUNTS:  YES  PLAN OF CARE: Discharge to home after PACU  PATIENT DISPOSITION:  PACU - hemodynamically stable.   Delay start of Pharmacological VTE agent (>24hrs) due to surgical blood loss or risk of bleeding: not applicable  Dictation # [id number]

## 2015-11-23 NOTE — Anesthesia Postprocedure Evaluation (Signed)
Anesthesia Post Note  Patient: Marlou Porch  Procedure(s) Performed: Procedure(s) (LRB): MEDIASTINOSCOPY (N/A)  Patient location during evaluation: PACU Anesthesia Type: General Level of consciousness: awake and alert Pain management: pain level controlled Vital Signs Assessment: post-procedure vital signs reviewed and stable Respiratory status: spontaneous breathing, nonlabored ventilation, respiratory function stable and patient connected to nasal cannula oxygen Cardiovascular status: blood pressure returned to baseline and stable Postop Assessment: no signs of nausea or vomiting Anesthetic complications: no    Last Vitals:  Filed Vitals:   11/23/15 1545 11/23/15 1600  BP: 130/54 138/86  Pulse: 74 79  Temp: 36.6 C   Resp: 13 16    Last Pain:  Filed Vitals:   11/23/15 1604  PainSc: 10-Worst pain ever                 Yuvonne Lanahan S

## 2015-11-24 ENCOUNTER — Encounter (HOSPITAL_COMMUNITY): Payer: Self-pay | Admitting: Thoracic Surgery (Cardiothoracic Vascular Surgery)

## 2015-11-24 LAB — PULMONARY FUNCTION TEST

## 2015-11-24 NOTE — Op Note (Signed)
NAMEMADALYNE, HUSK                  ACCOUNT NO.:  [id number]  MEDICAL RECORD NO.:  [id number]  LOCATION:  [facility]                         FACILITY:  [facility]  PHYSICIAN:  Revonda Standard. Roxan Hockey, M.D.DATE OF BIRTH:  [birth year]  DATE OF PROCEDURE:  11/23/2015 DATE OF DISCHARGE:  11/23/2015                              OPERATIVE REPORT   PREOPERATIVE DIAGNOSIS:  Mediastinal adenopathy.  POSTOPERATIVE DIAGNOSIS:  Mediastinal adenopathy, malignant.  PROCEDURE:  Mediastinoscopy.  SURGEON:  Revonda Standard. Roxan Hockey, M.D.  ASSISTANT:  None.  ANESTHESIA:  General.  FINDINGS:  Level 7 node- benign.   Subcarinal mass- extensive necrotic debris  Initial frozen nondiagnostic.    Second showed definite cancer cells, but unclear as to the   underlying cell type.   Good hemostasis at completion of procedure.  CLINICAL NOTE:  Mrs. Chico is a 70 year old woman who presented with right-sided back pain, unrelieved with medication.  Workup revealed a right hilar and mediastinal mass.  This was confirmed with CT.  PET-CT showed 8-cm right hilar mediastinal mass with an SUV of 30.  There was no evidence of distant metastasis.  She underwent bronchoscopy and endobronchial ultrasound; however, findings were suspicious but not definitive for malignancy.  She was referred for mediastinoscopy.  The indications, risks, benefits, and alternatives were discussed in detail with the patient.  She understood the risks, particularly the risk of stroke, which was elevated in her case.  She accepted those risks and agreed to proceed.  OPERATIVE NOTE:  Mrs. Bomba was brought to the operating room on November 23, 2015.  An arterial line was placed in the right radial artery.  She was given intravenous antibiotics.  She was anesthetized and intubated.  The neck and chest were prepped and draped in usual sterile fashion.  After performing a time-out, a transverse incision was made 1 fingerbreadth above the sternal notch.  It  was carried through the skin and subcutaneous tissues.  The strap muscles were separated in the midline. The pretracheal fascia was identified and incised and the pretracheal plane was developed bluntly into the mediastinum.  The mediastinoscope was inserted.  No nodes of note were encountered in the paratracheal spaces.  In the subcarinal space, there was a lymph node, which was relatively normal in appearance.  This was biopsied and sent for frozen section.  It returned showing benign lymph node.  Working further into the subcarinal space, a large mass was noted, this had very indistinct borders, biopsies were taken.  There was a large amount of necrotic and old bloody debris within the mass.  There was extensive necrotic tissue as well.  Initial biopsies were sent for frozen section, but were nondiagnostic.  The second biopsy sent for frozen section showed cancer.  The remainder of the specimens were sent for permanent pathology.  A total of four separate specimens were sent in all.  The cavity was packed with Surgicel.  The scope was removed.  The wound was packed. After 5 minutes, the gauze packing was removed.  The scope was reinserted, there was no ongoing active bleeding.  The mediastinoscope was withdrawn.  The incision was closed in two layers.  Dermabond was applied  to the skin.  The patient was extubated in the operating room and taken to the postanesthetic care unit in good condition.     Revonda Standard Roxan Hockey, M.D.     SCH/MEDQ  D:  11/23/2015  T:  11/24/2015  Job:  [id number]

## 2015-11-26 ENCOUNTER — Encounter: Payer: Self-pay | Admitting: *Deleted

## 2015-11-26 ENCOUNTER — Telehealth: Payer: Self-pay | Admitting: *Deleted

## 2015-11-26 DIAGNOSIS — R918 Other nonspecific abnormal finding of lung field: Secondary | ICD-10-CM

## 2015-11-26 NOTE — Telephone Encounter (Signed)
Oncology Nurse Navigator Documentation  Oncology Nurse Navigator Flowsheets 11/26/2015  Navigator Encounter Type Introductory phone call  Treatment Phase Pre-Tx/Tx Discussion  Barriers/Navigation Needs Coordination of Care  Interventions Coordination of Care  Acuity Level 1  Acuity Level 1 Initial guidance, education and coordination as needed  Time Spent with Patient 15   I received a referral on Katherine Bullock today.  I called and gave her an appt to see Dr. Julien Nordmann on 12/04/15 arrive at 9:30.  She verbalized understanding of appt time and place.

## 2015-11-30 ENCOUNTER — Telehealth: Payer: Self-pay | Admitting: Pulmonary Disease

## 2015-11-30 ENCOUNTER — Other Ambulatory Visit: Payer: Self-pay | Admitting: *Deleted

## 2015-11-30 NOTE — Telephone Encounter (Signed)
PFT 11/05/15: FVC 1.97 L (66%) FEV1 1.51 L (67%) FEV1/FVC 0.76 FEF 25-75 1.25 L (67%) positive bronchodilator response TLC 5.56 L (110%) RV 130% ERV 94% DLCO uncorrected 54%

## 2015-12-01 ENCOUNTER — Ambulatory Visit (HOSPITAL_COMMUNITY)
Admission: RE | Admit: 2015-12-01 | Discharge: 2015-12-01 | Disposition: A | Discharge status: Home / Self Care | Payer: PPO | Source: Ambulatory Visit | Attending: Pulmonary Disease | Admitting: Pulmonary Disease

## 2015-12-01 ENCOUNTER — Encounter: Payer: Self-pay | Admitting: Pulmonary Disease

## 2015-12-01 ENCOUNTER — Ambulatory Visit (INDEPENDENT_AMBULATORY_CARE_PROVIDER_SITE_OTHER): Payer: PPO | Admitting: Pulmonary Disease

## 2015-12-01 VITALS — BP 118/62 | HR 68 | Ht 64.0 in | Wt 122.6 lb

## 2015-12-01 DIAGNOSIS — J439 Emphysema, unspecified: Secondary | ICD-10-CM | POA: Diagnosis not present

## 2015-12-01 DIAGNOSIS — F17200 Nicotine dependence, unspecified, uncomplicated: Secondary | ICD-10-CM

## 2015-12-01 DIAGNOSIS — R918 Other nonspecific abnormal finding of lung field: Secondary | ICD-10-CM | POA: Diagnosis not present

## 2015-12-01 DIAGNOSIS — J449 Chronic obstructive pulmonary disease, unspecified: Secondary | ICD-10-CM

## 2015-12-01 LAB — PULMONARY FUNCTION TEST
DL/VA % pred: 57 %
DL/VA: 2.75 ml/min/mmHg/L
DLCO UNC: 10.48 ml/min/mmHg
DLCO unc % pred: 43 %
FEF 25-75 Post: 1.44 L/sec
FEF 25-75 Pre: 1.12 L/sec
FEF2575-%CHANGE-POST: 29 %
FEF2575-%PRED-POST: 77 %
FEF2575-%Pred-Pre: 60 %
FEV1-%CHANGE-POST: 9 %
FEV1-%PRED-PRE: 77 %
FEV1-%Pred-Post: 85 %
FEV1-POST: 1.91 L
FEV1-PRE: 1.74 L
FEV1FVC-%Change-Post: 7 %
FEV1FVC-%Pred-Pre: 88 %
FEV6-%Change-Post: 3 %
FEV6-%PRED-POST: 93 %
FEV6-%Pred-Pre: 90 %
FEV6-POST: 2.64 L
FEV6-PRE: 2.56 L
FEV6FVC-%CHANGE-POST: 1 %
FEV6FVC-%PRED-PRE: 103 %
FEV6FVC-%Pred-Post: 105 %
FVC-%CHANGE-POST: 1 %
FVC-%PRED-POST: 88 %
FVC-%PRED-PRE: 87 %
FVC-POST: 2.64 L
FVC-Pre: 2.59 L
POST FEV1/FVC RATIO: 72 %
Post FEV6/FVC ratio: 100 %
Pre FEV1/FVC ratio: 67 %
Pre FEV6/FVC Ratio: 99 %
RV % PRED: 103 %
RV: 2.29 L
TLC % pred: 94 %
TLC: 4.8 L

## 2015-12-01 MED ORDER — ALBUTEROL SULFATE (2.5 MG/3ML) 0.083% IN NEBU
2.5000 mg | INHALATION_SOLUTION | Freq: Once | RESPIRATORY_TRACT | Status: AC
  Administered 2015-12-01: 2.5 mg via RESPIRATORY_TRACT

## 2015-12-01 MED ORDER — TIOTROPIUM BROMIDE MONOHYDRATE 2.5 MCG/ACT IN AERS: 2.0000 | INHALATION_SPRAY | RESPIRATORY_TRACT | 3 refills | Status: AC

## 2015-12-01 MED ORDER — OXYCODONE-ACETAMINOPHEN 2.5-325 MG PO TABS: 1.0000 | ORAL_TABLET | Freq: Four times a day (QID) | ORAL | 0 refills | Status: DC | PRN

## 2015-12-01 NOTE — Patient Instructions (Addendum)
   I'm starting you on Spiriva to help with your cough & breathing. Remember to do 2 inhalations back to back at the same time every day. Call me if you have any adverse reactions to this medication as it can cause trouble urinating and dry mouth.  Stop taking the pain medication I'm starting today if you develop any rashes, itching, swelling in your lips/tongue, etc.   Do not take any extra tylenol/acetaminophen as the pain medication already has tylenol in it.  Call Dr. Roxan Hockey if you have ongoing pain or if the Percocet I am starting today doesn't help.  I will see you back in 3 months with a breathing and walking test at that time but call me if you have any new breathing problems or questions before then.   TESTS ORDERED: 1. Spirometry with bronchodilator challenge & DLCO at next appointment 2. 6MWT on room air at next appointment

## 2015-12-01 NOTE — Progress Notes (Signed)
Subjective:    Patient ID: Katherine Bullock, female    DOB: 09/15/1945, 70 y.o.   MRN: [id number]  C.C.:  Follow-up for Lung/Mediastinal Mass, Mild COPD w/ Emphysema, & Ongoing Tobacco Use.  HPI Lung/Mediastinal Mass:  EBUS FNA cytology is indeterminate and flow cytometry was negative despite markedly atypical cells. Patient has underwent mediastinoscopy with lymph node biopsy and pending pathological review by outside university. Patient reports her pain is excruciating and she has no significant relief with use of the Lortab 5 that was prescribed by [facility]. Reports she did have some itching with Oxycontin in the past but no rashes or angioedema. She is reporting some dysphagia.   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  Pulmonary function testing shows mild airway obstruction. She reports rare, intermittent coughing. She reports she has blood mixed in with her mucus intermittently. She has had occasional wheezing. She reports her dyspnea is worse in the morning.   Ongoing Tobacco Use:  She reports she has cut back to 2 ppweek. She is tapering herself off gradually.   Review of Systems She reports she had a fever for a day that resolved. No sweats or chills. Reports rare headaches. No focal vision loss, weakness, numbness, or tingling.   Allergies  Allergen Reactions  . Amitriptyline Itching  . Flexeril [Cyclobenzaprine Hcl] Itching  . Oxycontin [Oxycodone Hcl] Itching    Current Outpatient Prescriptions on File Prior to Visit  Medication Sig Dispense Refill  . amLODipine (NORVASC) 5 MG tablet Take 5 mg by mouth daily.    Marland Kitchen aspirin 81 MG tablet Take 162 mg by mouth daily.     Marland Kitchen atorvastatin (LIPITOR) 40 MG tablet Take 40 mg by mouth daily.    . citalopram (CELEXA) 40 MG tablet Take 40 mg by mouth daily.    . fenofibrate 160 MG tablet Take 160 mg by mouth daily.    Marland Kitchen glimepiride (AMARYL) 1 MG tablet Take 0.5 mg by mouth daily with  breakfast.    . hydrALAZINE (APRESOLINE) 25 MG tablet Take 25 mg by mouth 3 (three) times daily.    Marland Kitchen HYDROmorphone (DILAUDID) 2 MG tablet Take 0.5-1 tablets (1-2 mg total) by mouth every 6 (six) hours as needed for moderate pain or severe pain. 30 tablet 0  . Insulin Glargine (LANTUS SOLOSTAR) 100 UNIT/ML Solostar Pen Inject 60 Units into the skin 2 (two) times daily.    Marland Kitchen lisinopril (PRINIVIL,ZESTRIL) 20 MG tablet Take 20 mg by mouth 2 (two) times daily.    . metFORMIN (GLUCOPHAGE) 1000 MG tablet Take 1,000 mg by mouth 2 (two) times daily.    . Multiple Vitamins-Minerals (ZINC PO) Take 1 tablet by mouth daily.    Marland Kitchen spironolactone (ALDACTONE) 25 MG tablet Take 25 mg by mouth daily.     No current facility-administered medications on file prior to visit.     Past Medical History:  Diagnosis Date  . Anxiety   . Arthritis   . Cataract   . Cholelithiases   . COPD (chronic obstructive pulmonary disease) ([place])   . Depression   . Diabetes mellitus   . Hyperlipidemia   . Hypertension   . Stroke [facility])     Past Surgical History:  Procedure Laterality Date  . ABDOMINAL HYSTERECTOMY    . APPENDECTOMY    . BLADDER SURGERY    . CATARACT EXTRACTION Bilateral   . CHOLECYSTECTOMY    . ENDOBRONCHIAL ULTRASOUND Bilateral 11/02/2015  Procedure: ENDOBRONCHIAL ULTRASOUND;  Surgeon: Javier Glazier, MD;  Location: [facility];  Service: Cardiopulmonary;  Laterality: Bilateral;  . MANDIBLE SURGERY    . MEDIASTINOSCOPY N/A 11/23/2015   Procedure: MEDIASTINOSCOPY;  Surgeon: Melrose Nakayama, MD;  Location: [facility] OR;  Service: Thoracic;  Laterality: N/A;  . TUBAL LIGATION      Family History  Problem Relation Age of Onset  . Diabetes Mother   . Heart disease Mother   . Breast cancer Mother   . Hyperlipidemia Mother   . Hypertension Mother   . Heart attack Mother   . Heart disease Father   . Heart attack Father   . Hypertension Son   . Breast cancer Maternal Aunt   . Lung disease Neg Hx      Social History   Social History  . Marital status: Widowed    Spouse name: N/A  . Number of children: N/A  . Years of education: N/A   Occupational History  . retired    Social History Main Topics  . Smoking status: Current Every Day Smoker    Packs/day: 1.00    Years: 50.00    Types: Cigarettes    Start date: 05/09/1965  . Smokeless tobacco: Current User    Types: Snuff     Comment: Peak rate of 1ppd  . Alcohol use No  . Drug use: No  . Sexual activity: Not Asked   Other Topics Concern  . None   Social History Narrative   Originally from [place]. Previously worked Psychologist, sport and exercise. She recently just got a dog. No bird exposure. No mold exposure.        Objective:   Physical Exam BP 118/62 (BP Location: Left Arm, Cuff Size: Normal)   Pulse 68   Ht 5' 4"  (1.626 m)   Wt 122 lb 9.6 oz (55.6 kg)   SpO2 99%   BMI 21.04 kg/m  General:  Awake. Tearful & uncomfortable. Sister with her today.  Integument:  Warm & dry. No rash on exposed skin. Healing sternotomy incision clean, dry, & in tact. Lymphatics:  No appreciated cervical or supraclavicular lymphadenoapthy. HEENT:  Moist mucus membranes. No scleral icterus. No nasal turbinate swelling. Cardiovascular:  Regular rate. No edema. Normal S1 & S2. Pulmonary:  Good aeration bilaterally. Clear on auscultation. Normal work of breathing on room air. Neurological:  CN 2-12 grossly in tact. No meningismus. Moving all 4 extremities equally.   PFT 12/01/15: FVC 2.59 L (87%) FEV1 1.74 L (77%) FEV1/FVC 0.67 FEF 25-75 1.12 L (60%) negative bronchodilator response TLC 4.80 L (94%) RV 103% ERV 123% DLCO uncorrected 43% (results do not meet ATS standards) 11/05/15: FVC 1.97 L (66%) FEV1 1.51 L (67%) FEV1/FVC 0.76 FEF 25-75 1.25 L (67%) positive bronchodilator response TLC 5.56 L (110%) RV 130% ERV 94% DLCO uncorrected 54%  IMAGING CXR PA/LAT 11/06/15 (previously reviewed by me): Right hilar/subcarinal mass unchanged. No parenchymal  nodule or opacity appreciated. No pleural effusion. Heart normal in size & mediastinum otherwise normal in contour. No pneumothorax.  PET CT 10/30/15 (previously reviewed by me): 8 cm central right lung mass invading right hilum and mediastinum markedly hypermetabolic. Borderline enlargement of right supraclavicular lymph nodes without hypermetabolism. Trace right pleural effusion. Subcentimeter precarinal lymph node noted as well.  CT CHEST W/O 10/22/15 (previously reviewed by me): Large subcarinal heterogeneous mass. There is questionable a subcarinal nodule on CT imaging form 2015 by my review. No pathologic lymphadenopathy. No pleural effusion or thickening. Apical predominant emphysematous changes.  No pericardial effusion.  PATHOLOGY MEDIASTINOSCOPY BIOPSY (11/23/15):  Pending w/ Atypical Cells EBUS FNA (11/02/15):  Level 7 - Atypical Cells Suspicious for Malignancy / Level 4R - Atypical Cells Suspicious for Malignancy & Negative Flow Cytometry  LABS 11/04/15 CBC: 20.0/14.0/41.6/432 BMP: 137/3.9/98/28/7/0.79/143/10.1 LFT: 4.3/8.0/0.5/95/25/19 CRP: 3.4 ESR:  31 CK: 134 CK-MB: 1.6 Myoglobin: 35 Aldolase: Pending  10/27/15 Alpha-1 antitrypsin: MZ (112)    Assessment & Plan:  70 year old female found to have subcarinal mass and mild COPD w/ emphysema. Given patient's symptoms I am starting her on inhaled medication therapy with Spiriva. We discussed her postoperative pain at length and given lack of relief with Lortab I feel that treatment with Percocet is reasonable. I reviewed her previous reaction to oxycodone which was only itching and did caution the patient against potential further allergic reaction to Percocet. Patient acknowledged understanding. I also counseled her for over 3 minutes on the need for complete tobacco cessation to prevent worsening of lung function and also increase the effectiveness of likely future chemotherapy. I instructed the patient contact my office if she had any  new breathing problems before next appointment and contact Dr. Roxan Hockey if pain did not significantly improve.  1. Lung/Mediastinal Mass:  Final pathology pending outside review. Has an appointment with Dr. Julien Nordmann. 2. Postoperative Pain: Patient prescribed Percocet 2.5/325. Counseled to avoid excessive Tylenol & monitor for signs/symptoms of allergic reaction. She will contact Dr. Roxan Hockey if pain persists or is not relieved by Percocet. 3. Mild COPD w/ Emphysema: Starting patient on Spiriva Respimat. Repeat spirometry with DLCO & bronchodilator challenge at next appointment. Checking 6MWT on room air at next appointment. 4. Ongoing Tobacco Use: Patient counseled for over 3 minutes and need for complete tobacco cessation. 5. Health Maintenance: Received influenza vaccine October 2016 & Pneumovax January 2014. Patient has not had Prevnar vaccine but declines it today. Advised patient to obtain vaccine prior to initiation of chemotherapy. 6. Follow-up: Patient to return to clinic in 3 months or sooner if needed.  Sonia Baller Ashok Cordia, M.D. [facility] Pager:  [phone] After 3pm or if no response, call [phone] 11:47 AM 12/01/15

## 2015-12-02 ENCOUNTER — Telehealth: Payer: Self-pay | Admitting: Pulmonary Disease

## 2015-12-02 NOTE — Telephone Encounter (Signed)
Called pt. She reports she has been unable to get Rx for percocet 2.5-325 mg.  Called [facility] regarding pt percocet Rx. They do not carry the percocet 2.5 mg strength.   Dr. Ashok Cordia are you okay with writing percocet 5-325 mg? thanks

## 2015-12-02 NOTE — Telephone Encounter (Signed)
We can change the Prescription to Percocet 5/325 - 1 tablets po q6hr prn pain - #30.

## 2015-12-03 MED ORDER — OXYCODONE-ACETAMINOPHEN 5-325 MG PO TABS: 1.0000 | ORAL_TABLET | Freq: Four times a day (QID) | ORAL | 0 refills | Status: DC | PRN

## 2015-12-03 NOTE — Telephone Encounter (Signed)
Rx has been printed, signed by Durene Cal Placed upfront for pick up.  Pt is aware. Nothing further needed

## 2015-12-04 ENCOUNTER — Ambulatory Visit (HOSPITAL_BASED_OUTPATIENT_CLINIC_OR_DEPARTMENT_OTHER): Payer: PPO | Admitting: Internal Medicine

## 2015-12-04 ENCOUNTER — Telehealth: Payer: Self-pay | Admitting: Internal Medicine

## 2015-12-04 ENCOUNTER — Encounter: Payer: Self-pay | Admitting: Internal Medicine

## 2015-12-04 ENCOUNTER — Other Ambulatory Visit (HOSPITAL_BASED_OUTPATIENT_CLINIC_OR_DEPARTMENT_OTHER): Payer: PPO

## 2015-12-04 DIAGNOSIS — R918 Other nonspecific abnormal finding of lung field: Secondary | ICD-10-CM | POA: Diagnosis not present

## 2015-12-04 LAB — CBC WITH DIFFERENTIAL/PLATELET
BASO%: 0.5 % (ref 0.0–2.0)
Basophils Absolute: 0.1 10*3/uL (ref 0.0–0.1)
EOS%: 1.7 % (ref 0.0–7.0)
Eosinophils Absolute: 0.2 10*3/uL (ref 0.0–0.5)
HCT: 38.3 % (ref 34.8–46.6)
HEMOGLOBIN: 12.7 g/dL (ref 11.6–15.9)
LYMPH#: 2.1 10*3/uL (ref 0.9–3.3)
LYMPH%: 17.7 % (ref 14.0–49.7)
MCH: 29.9 pg (ref 25.1–34.0)
MCHC: 33.2 g/dL (ref 31.5–36.0)
MCV: 90.1 fL (ref 79.5–101.0)
MONO#: 0.9 10*3/uL (ref 0.1–0.9)
MONO%: 7.2 % (ref 0.0–14.0)
NEUT#: 8.8 10*3/uL — ABNORMAL HIGH (ref 1.5–6.5)
NEUT%: 72.9 % (ref 38.4–76.8)
Platelets: 428 10*3/uL — ABNORMAL HIGH (ref 145–400)
RBC: 4.25 10*6/uL (ref 3.70–5.45)
RDW: 13 % (ref 11.2–14.5)
WBC: 12.1 10*3/uL — ABNORMAL HIGH (ref 3.9–10.3)
nRBC: 0 % (ref 0–0)

## 2015-12-04 LAB — COMPREHENSIVE METABOLIC PANEL
ALBUMIN: 3.2 g/dL — AB (ref 3.5–5.0)
ALK PHOS: 116 U/L (ref 40–150)
ALT: 13 U/L (ref 0–55)
AST: 17 U/L (ref 5–34)
Anion Gap: 12 mEq/L — ABNORMAL HIGH (ref 3–11)
BUN: 9 mg/dL (ref 7.0–26.0)
CHLORIDE: 100 meq/L (ref 98–109)
CO2: 26 meq/L (ref 22–29)
Calcium: 9.8 mg/dL (ref 8.4–10.4)
Creatinine: 0.9 mg/dL (ref 0.6–1.1)
EGFR: 61 mL/min/{1.73_m2} — AB (ref >90)
GLUCOSE: 213 mg/dL — AB (ref 70–140)
POTASSIUM: 4.1 meq/L (ref 3.5–5.1)
SODIUM: 139 meq/L (ref 136–145)
Total Bilirubin: 0.59 mg/dL (ref 0.20–1.20)
Total Protein: 7.6 g/dL (ref 6.4–8.3)

## 2015-12-04 NOTE — Telephone Encounter (Signed)
pt spouse cld to r/s appt-gave updated time & date

## 2015-12-04 NOTE — Progress Notes (Signed)
[place] Telephone:([phone]   [phone]  CONSULT NOTE  REFERRING PHYSICIAN: Dr. Modesto Charon  REASON FOR CONSULTATION:  70 years old white female with questionable lung cancer.  HPI Katherine Bullock is a 70 y.o. female with past medical history significant for multiple medical problems including history of COPD, anxiety, depression, diabetes mellitus, dyslipidemia, hypertension, stroke as well as osteoarthritis. The patient also has a long history of smoking. She has been complaining of shortness of breath and cough for the last 3 months. She was seen by her primary care physician and chest x-ray was performed on 10/19/2015 and it showed prominent mass measuring approximately 6.7 x 5.7 cm in the retrohilar region on the right. This was suspicious for lung cancer. This was followed by CT scan of the chest on 10/22/2015 and it showed he'll nodule or mass in the right lower lobe posterior hilar region measures at least 5.7 x 5.2 cm. The lesion is extending into the posterior mediastinum posterior to the right main bronchus and in subcarinal region. The lesion measured at least 8.0 cm craniocaudally. This is highly suspicious for malignancy. A PET scan was performed on 10/30/2015 and it showed 8.0 cm central right lung mass invading the right hilum and mediastinum, markedly hypermetabolic and consistent with neoplasm. There was borderline enlarged right supraclavicular nodes but no hypermetabolism. There was also suspicious small malignant right pleural effusion. There was no findings for metastatic disease involving the abdomen/pelvis or bony structures. The patient was referred to Dr. Ashok Cordia and she underwent bronchoscopy with endobronchial ultrasound and needle aspiration. The final pathology was not conclusive for malignancy but it showed some atypical cells. The patient was referred to Dr. Roxan Hockey and she underwent mediastinoscopy on 11/23/2015. Preliminary  report was suspicious for carcinoma but the biopsy was sent for consultation and the final results are not available. Dr. Roxan Hockey kindly referred the patient to me today for further evaluation and recommendation regarding treatment of her condition. When seen today the patient continues to complain of pain on the right side of the back as well as cough with occasional hemoptysis after the recent mediastinoscopy. She also has tightness in her chest. She denied having any significant nausea, vomiting, diarrhea or constipation. She has lack of appetite as well as mild dysphagia and she lost around 16 pounds in the last few months. She has no headache or visual changes. Family history significant for mother with breast cancer and died from old age. Father died from a heart attack. The patient is a well-developed and has 5 children. She was accompanied today by her daughter Katherine Bullock. She used to work in Radiation protection practitioner. She has a history of smoking 1 pack per day for around 50 years and unfortunately she continues to Bullock. She has no history of alcohol or drug abuse.   HPI  Past Medical History:  Diagnosis Date  . Anxiety   . Arthritis   . Cataract   . Cholelithiases   . COPD (chronic obstructive pulmonary disease) ([place])   . Depression   . Diabetes mellitus   . Hyperlipidemia   . Hypertension   . Stroke [facility])     Past Surgical History:  Procedure Laterality Date  . ABDOMINAL HYSTERECTOMY    . APPENDECTOMY    . BLADDER SURGERY    . CATARACT EXTRACTION Bilateral   . CHOLECYSTECTOMY    . ENDOBRONCHIAL ULTRASOUND Bilateral 11/02/2015   Procedure: ENDOBRONCHIAL ULTRASOUND;  Surgeon: Javier Glazier, MD;  Location:  [facility]COPY;  Service: Cardiopulmonary;  Laterality: Bilateral;  . MANDIBLE SURGERY    . MEDIASTINOSCOPY N/A 11/23/2015   Procedure: MEDIASTINOSCOPY;  Surgeon: Melrose Nakayama, MD;  Location: [facility] OR;  Service: Thoracic;  Laterality: N/A;  . TUBAL LIGATION      Family  History  Problem Relation Age of Onset  . Diabetes Mother   . Heart disease Mother   . Breast cancer Mother   . Hyperlipidemia Mother   . Hypertension Mother   . Heart attack Mother   . Heart disease Father   . Heart attack Father   . Hypertension Son   . Breast cancer Maternal Aunt   . Lung disease Neg Hx     Social History Social History  Substance Use Topics  . Smoking status: Current Every Day Smoker    Packs/day: 1.00    Years: 50.00    Types: Cigarettes    Start date: 05/09/1965  . Smokeless tobacco: Current User    Types: Snuff     Comment: Peak rate of 1ppd  . Alcohol use No    Allergies  Allergen Reactions  . Amitriptyline Itching  . Flexeril [Cyclobenzaprine Hcl] Itching  . Oxycontin [Oxycodone Hcl] Itching    Current Outpatient Prescriptions  Medication Sig Dispense Refill  . amLODipine (NORVASC) 5 MG tablet Take 5 mg by mouth daily.    Marland Kitchen aspirin 81 MG tablet Take 162 mg by mouth daily.     Marland Kitchen atorvastatin (LIPITOR) 40 MG tablet Take 40 mg by mouth daily.    . citalopram (CELEXA) 40 MG tablet Take 40 mg by mouth daily.    . fenofibrate 160 MG tablet Take 160 mg by mouth daily.    Marland Kitchen glimepiride (AMARYL) 1 MG tablet Take 0.5 mg by mouth daily with breakfast.    . hydrALAZINE (APRESOLINE) 25 MG tablet Take 25 mg by mouth 3 (three) times daily.    . Insulin Glargine (LANTUS SOLOSTAR) 100 UNIT/ML Solostar Pen Inject 60 Units into the skin 2 (two) times daily.    Marland Kitchen lisinopril (PRINIVIL,ZESTRIL) 20 MG tablet Take 20 mg by mouth 2 (two) times daily.    . metFORMIN (GLUCOPHAGE) 1000 MG tablet Take 1,000 mg by mouth 2 (two) times daily.    . Multiple Vitamins-Minerals (ZINC PO) Take 1 tablet by mouth daily.    Marland Kitchen oxyCODONE-acetaminophen (PERCOCET) 5-325 MG tablet Take 1 tablet by mouth every 6 (six) hours as needed for severe pain. 30 tablet 0  . spironolactone (ALDACTONE) 25 MG tablet Take 25 mg by mouth daily.    . Tiotropium Bromide Monohydrate (SPIRIVA RESPIMAT)  2.5 MCG/ACT AERS Inhale 2 puffs into the lungs every morning. 4 g 3  . HYDROmorphone (DILAUDID) 2 MG tablet Take 0.5-1 tablets (1-2 mg total) by mouth every 6 (six) hours as needed for moderate pain or severe pain. (Patient not taking: Reported on 12/04/2015) 30 tablet 0   No current facility-administered medications for this visit.     Review of Systems  Constitutional: positive for anorexia, fatigue and weight loss Eyes: negative Ears, nose, mouth, throat, and face: negative Respiratory: positive for cough, dyspnea on exertion and hemoptysis Cardiovascular: negative Gastrointestinal: negative Genitourinary:negative Integument/breast: negative Hematologic/lymphatic: negative Musculoskeletal:positive for back pain Neurological: negative Behavioral/Psych: negative Endocrine: negative Allergic/Immunologic: negative  Physical Exam  LFY:BOFBP, healthy, no distress, well nourished, well developed and anxious SKIN: skin color, texture, turgor are normal, no rashes or significant lesions HEAD: Normocephalic, No masses, lesions, tenderness or abnormalities EYES: normal, PERRLA, Conjunctiva  are pink and non-injected EARS: External ears normal, Canals clear OROPHARYNX:no exudate, no erythema and lips, buccal mucosa, and tongue normal  NECK: supple, no adenopathy, no JVD LYMPH:  no palpable lymphadenopathy, no hepatosplenomegaly BREAST:not examined LUNGS: clear to auscultation , and palpation HEART: regular rate & rhythm, no murmurs and no gallops ABDOMEN:abdomen soft, non-tender, normal bowel sounds and no masses or organomegaly BACK: Back symmetric, no curvature., No CVA tenderness EXTREMITIES:no joint deformities, effusion, or inflammation, no edema, no skin discoloration  NEURO: alert & oriented x 3 with fluent speech, no focal motor/sensory deficits  PERFORMANCE STATUS: ECOG 1  LABORATORY DATA: Lab Results  Component Value Date   WBC 12.1 (H) 12/04/2015   HGB 12.7 12/04/2015     HCT 38.3 12/04/2015   MCV 90.1 12/04/2015   PLT 428 (H) 12/04/2015      Chemistry      Component Value Date/Time   NA 139 12/04/2015 0846   K 4.1 12/04/2015 0846   CL 100 (L) 11/19/2015 1023   CO2 26 12/04/2015 0846   BUN 9.0 12/04/2015 0846   CREATININE 0.9 12/04/2015 0846      Component Value Date/Time   CALCIUM 9.8 12/04/2015 0846   ALKPHOS 116 12/04/2015 0846   AST 17 12/04/2015 0846   ALT 13 12/04/2015 0846   BILITOT 0.59 12/04/2015 0846       RADIOGRAPHIC STUDIES: Dg Chest 2 View  Result Date: 11/19/2015 CLINICAL DATA:  Preop for biopsy of right lung lesion EXAM: CHEST  2 VIEW COMPARISON:  Chest x-ray of 11/06/2015, PET-CT of 10/30/2015, and CT chest of 10/21/2005 FINDINGS: The right hilar and mediastinal mass noted on previous CT is unchanged by chest x-ray. No parenchymal infiltrate or pleural effusion is seen and no significant atelectasis is noted. Mediastinal and hilar contours are unchanged and the heart is within normal limits in size. No bony abnormality is seen. IMPRESSION: No change in right hilar -mediastinal mass. No parenchymal infiltrate or pleural effusion is seen. Electronically Signed   By: Ivar Drape M.D.   On: 11/19/2015 11:51   Dg Chest 2 View  Result Date: 11/06/2015 CLINICAL DATA:  Right lung biopsy 5 days ago. Patient reports shortness of breath. EXAM: CHEST  2 VIEW COMPARISON:  Portable chest x-ray of November 02, 2015 FINDINGS: A large right hilar mass persists and is grossly stable. There is no mediastinal shift. There is no pneumothorax, pneumomediastinum, or pleural effusion. There is no alveolar infiltrate. The interstitial markings of both lungs remain increased. The heart is normal in size. The pulmonary vascularity is not engorged. There is calcification in the wall of the aortic arch. There is multilevel degenerative disc disease of the thoracic spine. There are few air-fluid levels within small bowel in the upper abdomen. IMPRESSION: Persistent  large right hilar mass. No evidence nor pneumonia nor of a postprocedure complication. There is underlying COPD. Aortic atherosclerosis. Electronically Signed   By: David  Martinique M.D.   On: 11/06/2015 14:53    ASSESSMENT: This is a very pleasant 70 years old white female with at least stage IIIA (T3, N2, M0) lung cancer pending final tissue diagnosis and the staging workup. It presents with large right hilar mass with mediastinal invasion. There was also questionable small right pleural effusion suspicious for malignant effusion.   PLAN: I had a lengthy discussion with the patient and her daughter today about her current disease is stage, prognosis and treatment options. I recommended for the patient to complete the staging workup by  ordering a MRI of the brain to rule out brain metastasis. I am still waiting for the final pathology for tissue confirmation and subtype. If the final pathology was consistent with non-small cell carcinoma, the patient may benefit from a course of concurrent chemoradiation. I will refer the patient to radiation oncology for preliminary evaluation and consideration of treatment once the pathology report is confirmed. I will see the patient back for follow-up visit in one week for reevaluation and more detailed discussion of her treatment options. For Bullock cessation, I strongly encouraged the patient to quit smoking and offered her to Bullock cessation program. The patient was advised to call immediately if she has any concerning symptoms in the interval.  The patient voices understanding of current disease status and treatment options and is in agreement with the current care plan.  All questions were answered. The patient knows to call the clinic with any problems, questions or concerns. We can certainly see the patient much sooner if necessary.  Thank you so much for allowing me to participate in the care of [place]. I will continue to follow up the patient with  you and assist in her care.  I spent 40 minutes counseling the patient face to face. The total time spent in the appointment was 60 minutes.  Disclaimer: This note was dictated with voice recognition software. Similar sounding words can inadvertently be transcribed and may not be corrected upon review.   Chanceler Pullin K. December 04, 2015, 10:10 AM

## 2015-12-08 ENCOUNTER — Telehealth: Payer: Self-pay | Admitting: Pulmonary Disease

## 2015-12-08 NOTE — Telephone Encounter (Signed)
Please let the patient know she may produce more mucus as she weans off the cigarettes. She can use over-the-counter Mucinex/Guaifenesin twice daily along with fluids to help her work it up. She should continue to use her Spiriva as prescribed as long as she is not having a problem specifically with the inhaled medication. She should call back if symptoms don't gradually improve for the next few weeks or things get worse. Thanks.

## 2015-12-08 NOTE — Telephone Encounter (Signed)
Spoke with the pt  She feels like the spiriva is not helping yet- just started on 12/01/15  I advised that it's not fasting acting, and will take a couple of wks before she may notice a difference  Then she states that her breathing is actually worse, and she is "rattling when I breathe"- esp worse at night  She denies any increased cough or wheezing  She states she has stopped smoking as of today, had "part of one yesterday"  Please advise, thanks!

## 2015-12-08 NOTE — Telephone Encounter (Signed)
Spoke with the pt and notified of recs per JN She verbalized understanding and nothing further needed

## 2015-12-09 ENCOUNTER — Telehealth: Payer: Self-pay | Admitting: Pulmonary Disease

## 2015-12-09 ENCOUNTER — Ambulatory Visit (HOSPITAL_COMMUNITY)
Admission: RE | Admit: 2015-12-09 | Discharge: 2015-12-09 | Disposition: A | Discharge status: Home / Self Care | Payer: PPO | Source: Ambulatory Visit | Attending: Internal Medicine | Admitting: Internal Medicine

## 2015-12-09 DIAGNOSIS — G319 Degenerative disease of nervous system, unspecified: Secondary | ICD-10-CM | POA: Insufficient documentation

## 2015-12-09 DIAGNOSIS — J329 Chronic sinusitis, unspecified: Secondary | ICD-10-CM | POA: Insufficient documentation

## 2015-12-09 DIAGNOSIS — R918 Other nonspecific abnormal finding of lung field: Secondary | ICD-10-CM | POA: Insufficient documentation

## 2015-12-09 DIAGNOSIS — I6782 Cerebral ischemia: Secondary | ICD-10-CM | POA: Insufficient documentation

## 2015-12-09 MED ORDER — GADOBENATE DIMEGLUMINE 529 MG/ML IV SOLN
15.0000 mL | Freq: Once | INTRAVENOUS | Status: AC | PRN
  Administered 2015-12-09: 11 mL via INTRAVENOUS

## 2015-12-09 NOTE — Telephone Encounter (Signed)
Pt called back, states that Dr. Roxan Hockey will not fill her Dilaudid rx because JN wrote her a hydrocodone rx.  Pt started crying, stating that her back and chest hurts and her Dilaudid is not helping with the pain.  I again stressed the recs below, stating that she needs to be evaluated at the ED if her pain is uncontrollable.  Pt expressed understanding of recs.  Nothing further needed at this time.

## 2015-12-09 NOTE — Telephone Encounter (Signed)
Pt calling, in severe pain. Pt states that she is taking the Percocet 5/325 as directed every 4 hours. Pt c/o severe pain in her lungs which is making it very hard to breathe. Pt states that she ran out of Dilaudid '2mg'$  and it had no refills. Pt instructed to contact [facility] office for a refill of this medication and to call our office back if unable to get this filled.  Pt also advised that if pain is very severe that she needs to go to the ED where they can better manage her pain. Pt refused because she does not have a ride and does not want to call the ED.  Pt to call back if she Dr [facility] office cannot call in a refill.

## 2015-12-10 ENCOUNTER — Ambulatory Visit
Admit: 2015-12-10 | Discharge: 2015-12-10 | Disposition: A | Discharge status: Home / Self Care | Payer: PPO | Attending: Radiation Oncology | Admitting: Radiation Oncology

## 2015-12-10 ENCOUNTER — Observation Stay (HOSPITAL_BASED_OUTPATIENT_CLINIC_OR_DEPARTMENT_OTHER): Payer: PPO

## 2015-12-10 ENCOUNTER — Encounter (HOSPITAL_COMMUNITY): Payer: Self-pay | Admitting: Internal Medicine

## 2015-12-10 ENCOUNTER — Inpatient Hospital Stay (HOSPITAL_COMMUNITY)
Admission: EM | Admit: 2015-12-10 | Discharge: 2015-12-18 | DRG: 180 | Disposition: A | Discharge status: Home / Self Care | Payer: PPO | Source: Other Acute Inpatient Hospital | Attending: Internal Medicine | Admitting: Internal Medicine

## 2015-12-10 ENCOUNTER — Telehealth: Payer: Self-pay | Admitting: Oncology

## 2015-12-10 ENCOUNTER — Other Ambulatory Visit: Payer: Self-pay | Admitting: Radiation Oncology

## 2015-12-10 DIAGNOSIS — Z833 Family history of diabetes mellitus: Secondary | ICD-10-CM

## 2015-12-10 DIAGNOSIS — R918 Other nonspecific abnormal finding of lung field: Secondary | ICD-10-CM

## 2015-12-10 DIAGNOSIS — F329 Major depressive disorder, single episode, unspecified: Secondary | ICD-10-CM | POA: Insufficient documentation

## 2015-12-10 DIAGNOSIS — E785 Hyperlipidemia, unspecified: Secondary | ICD-10-CM | POA: Diagnosis present

## 2015-12-10 DIAGNOSIS — I248 Other forms of acute ischemic heart disease: Secondary | ICD-10-CM | POA: Diagnosis present

## 2015-12-10 DIAGNOSIS — R1310 Dysphagia, unspecified: Secondary | ICD-10-CM | POA: Diagnosis present

## 2015-12-10 DIAGNOSIS — I160 Hypertensive urgency: Secondary | ICD-10-CM | POA: Diagnosis not present

## 2015-12-10 DIAGNOSIS — M1990 Unspecified osteoarthritis, unspecified site: Secondary | ICD-10-CM | POA: Insufficient documentation

## 2015-12-10 DIAGNOSIS — Z794 Long term (current) use of insulin: Secondary | ICD-10-CM

## 2015-12-10 DIAGNOSIS — I10 Essential (primary) hypertension: Secondary | ICD-10-CM | POA: Diagnosis present

## 2015-12-10 DIAGNOSIS — E119 Type 2 diabetes mellitus without complications: Secondary | ICD-10-CM | POA: Insufficient documentation

## 2015-12-10 DIAGNOSIS — I480 Paroxysmal atrial fibrillation: Secondary | ICD-10-CM | POA: Diagnosis present

## 2015-12-10 DIAGNOSIS — J9600 Acute respiratory failure, unspecified whether with hypoxia or hypercapnia: Secondary | ICD-10-CM | POA: Diagnosis present

## 2015-12-10 DIAGNOSIS — Z6821 Body mass index (BMI) 21.0-21.9, adult: Secondary | ICD-10-CM

## 2015-12-10 DIAGNOSIS — R0600 Dyspnea, unspecified: Secondary | ICD-10-CM

## 2015-12-10 DIAGNOSIS — Z8249 Family history of ischemic heart disease and other diseases of the circulatory system: Secondary | ICD-10-CM

## 2015-12-10 DIAGNOSIS — Z8673 Personal history of transient ischemic attack (TIA), and cerebral infarction without residual deficits: Secondary | ICD-10-CM

## 2015-12-10 DIAGNOSIS — Z79899 Other long term (current) drug therapy: Secondary | ICD-10-CM

## 2015-12-10 DIAGNOSIS — C3411 Malignant neoplasm of upper lobe, right bronchus or lung: Secondary | ICD-10-CM | POA: Insufficient documentation

## 2015-12-10 DIAGNOSIS — E1121 Type 2 diabetes mellitus with diabetic nephropathy: Secondary | ICD-10-CM

## 2015-12-10 DIAGNOSIS — Z7982 Long term (current) use of aspirin: Secondary | ICD-10-CM

## 2015-12-10 DIAGNOSIS — Z7189 Other specified counseling: Secondary | ICD-10-CM

## 2015-12-10 DIAGNOSIS — J449 Chronic obstructive pulmonary disease, unspecified: Secondary | ICD-10-CM | POA: Diagnosis present

## 2015-12-10 DIAGNOSIS — E43 Unspecified severe protein-calorie malnutrition: Secondary | ICD-10-CM | POA: Diagnosis present

## 2015-12-10 DIAGNOSIS — Z66 Do not resuscitate: Secondary | ICD-10-CM | POA: Diagnosis present

## 2015-12-10 DIAGNOSIS — J9601 Acute respiratory failure with hypoxia: Secondary | ICD-10-CM | POA: Diagnosis not present

## 2015-12-10 DIAGNOSIS — Z803 Family history of malignant neoplasm of breast: Secondary | ICD-10-CM

## 2015-12-10 DIAGNOSIS — F419 Anxiety disorder, unspecified: Secondary | ICD-10-CM | POA: Insufficient documentation

## 2015-12-10 DIAGNOSIS — K5900 Constipation, unspecified: Secondary | ICD-10-CM | POA: Diagnosis present

## 2015-12-10 DIAGNOSIS — F17210 Nicotine dependence, cigarettes, uncomplicated: Secondary | ICD-10-CM | POA: Diagnosis present

## 2015-12-10 DIAGNOSIS — E118 Type 2 diabetes mellitus with unspecified complications: Secondary | ICD-10-CM

## 2015-12-10 DIAGNOSIS — M549 Dorsalgia, unspecified: Secondary | ICD-10-CM | POA: Diagnosis present

## 2015-12-10 DIAGNOSIS — Z510 Encounter for antineoplastic radiation therapy: Secondary | ICD-10-CM | POA: Insufficient documentation

## 2015-12-10 DIAGNOSIS — R042 Hemoptysis: Secondary | ICD-10-CM | POA: Diagnosis present

## 2015-12-10 DIAGNOSIS — E8770 Fluid overload, unspecified: Secondary | ICD-10-CM | POA: Diagnosis present

## 2015-12-10 DIAGNOSIS — Z515 Encounter for palliative care: Secondary | ICD-10-CM

## 2015-12-10 DIAGNOSIS — J910 Malignant pleural effusion: Secondary | ICD-10-CM | POA: Diagnosis present

## 2015-12-10 LAB — COMPREHENSIVE METABOLIC PANEL
ALT: 13 U/L — ABNORMAL LOW (ref 14–54)
ANION GAP: 8 (ref 5–15)
AST: 18 U/L (ref 15–41)
Albumin: 3 g/dL — ABNORMAL LOW (ref 3.5–5.0)
Alkaline Phosphatase: 91 U/L (ref 38–126)
BILIRUBIN TOTAL: 0.1 mg/dL — AB (ref 0.3–1.2)
BUN: 5 mg/dL — ABNORMAL LOW (ref 6–20)
CALCIUM: 8.9 mg/dL (ref 8.9–10.3)
CO2: 25 mmol/L (ref 22–32)
Chloride: 103 mmol/L (ref 101–111)
Creatinine, Ser: 0.69 mg/dL (ref 0.44–1.00)
GFR calc Af Amer: >60 mL/min (ref >60)
Glucose, Bld: 209 mg/dL — ABNORMAL HIGH (ref 65–99)
POTASSIUM: 3.3 mmol/L — AB (ref 3.5–5.1)
Sodium: 136 mmol/L (ref 135–145)
TOTAL PROTEIN: 6.1 g/dL — AB (ref 6.5–8.1)

## 2015-12-10 LAB — CBC WITH DIFFERENTIAL/PLATELET
BASOS ABS: 0 10*3/uL (ref 0.0–0.1)
BASOS PCT: 0 %
EOS PCT: 4 %
Eosinophils Absolute: 0.4 10*3/uL (ref 0.0–0.7)
HEMATOCRIT: 34.2 % — AB (ref 36.0–46.0)
Hemoglobin: 10.8 g/dL — ABNORMAL LOW (ref 12.0–15.0)
LYMPHS PCT: 31 %
Lymphs Abs: 3 10*3/uL (ref 0.7–4.0)
MCH: 29.2 pg (ref 26.0–34.0)
MCHC: 31.6 g/dL (ref 30.0–36.0)
MCV: 92.4 fL (ref 78.0–100.0)
Monocytes Absolute: 0.7 10*3/uL (ref 0.1–1.0)
Monocytes Relative: 8 %
NEUTROS ABS: 5.7 10*3/uL (ref 1.7–7.7)
Neutrophils Relative %: 57 %
PLATELETS: 390 10*3/uL (ref 150–400)
RBC: 3.7 MIL/uL — AB (ref 3.87–5.11)
RDW: 13.1 % (ref 11.5–15.5)
WBC: 9.9 10*3/uL (ref 4.0–10.5)

## 2015-12-10 LAB — TROPONIN I

## 2015-12-10 LAB — GLUCOSE, CAPILLARY
GLUCOSE-CAPILLARY: 181 mg/dL — AB (ref 65–99)
GLUCOSE-CAPILLARY: 151 mg/dL — AB (ref 65–99)
Glucose-Capillary: 229 mg/dL — ABNORMAL HIGH (ref 65–99)
Glucose-Capillary: 88 mg/dL (ref 65–99)
Glucose-Capillary: 102 mg/dL — ABNORMAL HIGH (ref 65–99)

## 2015-12-10 LAB — MRSA PCR SCREENING: MRSA BY PCR: NEGATIVE

## 2015-12-10 LAB — ECHOCARDIOGRAM COMPLETE
HEIGHTINCHES: 64 in
WEIGHTICAEL: 2000.01 [oz_av]

## 2015-12-10 MED ORDER — ACETAMINOPHEN 325 MG PO TABS: 650.0000 mg | ORAL_TABLET | Freq: Four times a day (QID) | ORAL | Status: DC | PRN

## 2015-12-10 MED ORDER — CITALOPRAM HYDROBROMIDE 20 MG PO TABS
40.0000 mg | ORAL_TABLET | Freq: Every day | ORAL | Status: DC
  Administered 2015-12-10 – 2015-12-11 (×2): 40 mg via ORAL
  Filled 2015-12-10 (×2): qty 2

## 2015-12-10 MED ORDER — ONDANSETRON HCL 4 MG/2ML IJ SOLN
4.0000 mg | Freq: Four times a day (QID) | INTRAMUSCULAR | Status: DC | PRN
  Administered 2015-12-10 – 2015-12-11 (×3): 4 mg via INTRAVENOUS
  Filled 2015-12-10 (×3): qty 2

## 2015-12-10 MED ORDER — INSULIN ASPART 100 UNIT/ML ~~LOC~~ SOLN
0.0000 [IU] | Freq: Three times a day (TID) | SUBCUTANEOUS | Status: DC
  Administered 2015-12-10: 2 [IU] via SUBCUTANEOUS
  Administered 2015-12-11: 3 [IU] via SUBCUTANEOUS
  Administered 2015-12-11: 1 [IU] via SUBCUTANEOUS
  Administered 2015-12-11: 2 [IU] via SUBCUTANEOUS
  Administered 2015-12-12: 3 [IU] via SUBCUTANEOUS
  Administered 2015-12-13 (×2): 1 [IU] via SUBCUTANEOUS
  Administered 2015-12-13: 7 [IU] via SUBCUTANEOUS
  Administered 2015-12-14 (×2): 2 [IU] via SUBCUTANEOUS
  Administered 2015-12-15: 3 [IU] via SUBCUTANEOUS
  Administered 2015-12-15: 1 [IU] via SUBCUTANEOUS
  Administered 2015-12-15 – 2015-12-16 (×2): 2 [IU] via SUBCUTANEOUS
  Administered 2015-12-16: 1 [IU] via SUBCUTANEOUS

## 2015-12-10 MED ORDER — ASPIRIN 81 MG PO CHEW
162.0000 mg | CHEWABLE_TABLET | Freq: Every day | ORAL | Status: DC
  Administered 2015-12-10 – 2015-12-18 (×9): 162 mg via ORAL
  Filled 2015-12-10 (×9): qty 2

## 2015-12-10 MED ORDER — HYDRALAZINE HCL 20 MG/ML IJ SOLN
10.0000 mg | INTRAMUSCULAR | Status: DC | PRN
  Administered 2015-12-10 – 2015-12-11 (×2): 10 mg via INTRAVENOUS
  Filled 2015-12-10 (×2): qty 1

## 2015-12-10 MED ORDER — ALBUTEROL SULFATE (2.5 MG/3ML) 0.083% IN NEBU
2.5000 mg | INHALATION_SOLUTION | Freq: Three times a day (TID) | RESPIRATORY_TRACT | Status: DC
  Administered 2015-12-11 – 2015-12-13 (×8): 2.5 mg via RESPIRATORY_TRACT
  Filled 2015-12-10 (×7): qty 3

## 2015-12-10 MED ORDER — HYDRALAZINE HCL 25 MG PO TABS
25.0000 mg | ORAL_TABLET | Freq: Three times a day (TID) | ORAL | Status: DC
  Administered 2015-12-10 – 2015-12-11 (×5): 25 mg via ORAL
  Filled 2015-12-10 (×5): qty 1

## 2015-12-10 MED ORDER — ONDANSETRON HCL 4 MG PO TABS: 4.0000 mg | ORAL_TABLET | Freq: Four times a day (QID) | ORAL | Status: DC | PRN

## 2015-12-10 MED ORDER — NITROGLYCERIN 0.4 MG SL SUBL
SUBLINGUAL_TABLET | SUBLINGUAL | Status: AC
  Administered 2015-12-10: 0.4 mg
  Filled 2015-12-10: qty 1

## 2015-12-10 MED ORDER — LISINOPRIL 10 MG PO TABS
20.0000 mg | ORAL_TABLET | Freq: Two times a day (BID) | ORAL | Status: DC
  Administered 2015-12-10 – 2015-12-11 (×5): 20 mg via ORAL
  Filled 2015-12-10: qty 1
  Filled 2015-12-10 (×3): qty 2
  Filled 2015-12-10: qty 1

## 2015-12-10 MED ORDER — ALBUTEROL SULFATE (2.5 MG/3ML) 0.083% IN NEBU
2.5000 mg | INHALATION_SOLUTION | Freq: Four times a day (QID) | RESPIRATORY_TRACT | Status: DC
  Administered 2015-12-10 (×2): 2.5 mg via RESPIRATORY_TRACT
  Filled 2015-12-10 (×3): qty 3

## 2015-12-10 MED ORDER — AMLODIPINE BESYLATE 5 MG PO TABS
5.0000 mg | ORAL_TABLET | Freq: Every day | ORAL | Status: DC
  Administered 2015-12-10 – 2015-12-11 (×2): 5 mg via ORAL
  Filled 2015-12-10 (×2): qty 1

## 2015-12-10 MED ORDER — TIOTROPIUM BROMIDE MONOHYDRATE 18 MCG IN CAPS
18.0000 ug | ORAL_CAPSULE | Freq: Every day | RESPIRATORY_TRACT | Status: DC
  Administered 2015-12-12 – 2015-12-13 (×2): 18 ug via RESPIRATORY_TRACT
  Filled 2015-12-10 (×2): qty 5

## 2015-12-10 MED ORDER — INSULIN GLARGINE 100 UNIT/ML ~~LOC~~ SOLN
50.0000 [IU] | Freq: Every day | SUBCUTANEOUS | Status: DC
  Administered 2015-12-11: 50 [IU] via SUBCUTANEOUS
  Filled 2015-12-10: qty 0.5

## 2015-12-10 MED ORDER — ENSURE ENLIVE PO LIQD: 237.0000 mL | Freq: Two times a day (BID) | ORAL | Status: DC

## 2015-12-10 MED ORDER — GLUCERNA SHAKE PO LIQD
237.0000 mL | Freq: Three times a day (TID) | ORAL | Status: DC
Start: 2015-12-10 — End: 2015-12-17
  Administered 2015-12-11 – 2015-12-14 (×4): 237 mL via ORAL
  Filled 2015-12-10 (×21): qty 237

## 2015-12-10 MED ORDER — SPIRONOLACTONE 25 MG PO TABS
25.0000 mg | ORAL_TABLET | Freq: Every day | ORAL | Status: DC
  Administered 2015-12-10 – 2015-12-11 (×2): 25 mg via ORAL
  Filled 2015-12-10 (×2): qty 1

## 2015-12-10 MED ORDER — INSULIN GLARGINE 100 UNIT/ML ~~LOC~~ SOLN
60.0000 [IU] | Freq: Two times a day (BID) | SUBCUTANEOUS | Status: DC
Start: 2015-12-10 — End: 2015-12-10
  Administered 2015-12-10: 60 [IU] via SUBCUTANEOUS
  Filled 2015-12-10 (×3): qty 0.6

## 2015-12-10 MED ORDER — HYDROMORPHONE HCL 1 MG/ML IJ SOLN
0.5000 mg | INTRAMUSCULAR | Status: DC | PRN
  Administered 2015-12-10 – 2015-12-11 (×8): 0.5 mg via INTRAVENOUS
  Filled 2015-12-10 (×8): qty 1

## 2015-12-10 MED ORDER — ATORVASTATIN CALCIUM 40 MG PO TABS
40.0000 mg | ORAL_TABLET | Freq: Every day | ORAL | Status: DC
  Administered 2015-12-10 – 2015-12-18 (×9): 40 mg via ORAL
  Filled 2015-12-10 (×9): qty 1

## 2015-12-10 MED ORDER — FENOFIBRATE 160 MG PO TABS
160.0000 mg | ORAL_TABLET | Freq: Every day | ORAL | Status: DC
  Administered 2015-12-10 – 2015-12-13 (×4): 160 mg via ORAL
  Filled 2015-12-10 (×4): qty 1

## 2015-12-10 MED ORDER — ACETAMINOPHEN 650 MG RE SUPP: 650.0000 mg | Freq: Four times a day (QID) | RECTAL | Status: DC | PRN

## 2015-12-10 NOTE — Progress Notes (Signed)
Inpatient Diabetes Program Recommendations  AACE/ADA: New Consensus Statement on Inpatient Glycemic Control (2015)  Target Ranges:  Prepandial:   less than 140 mg/dL      Peak postprandial:   less than 180 mg/dL (1-2 hours)      Critically ill patients:  140 - 180 mg/dL   Results for SLYVIA, LARTIGUE (MRN [id number]) as of 12/10/2015 10:22  Ref. Range 12/10/2015 02:02 12/10/2015 07:48  Glucose-Capillary Latest Ref Range: 65 - 99 mg/dL 229 (H) 88   Review of Glycemic Control  Diabetes history: DM2 Outpatient Diabetes medications: Lantus 60 units BID (pt reports not taking it as ordered), Amaryl 0.5 mg QAM, Metformin 1000 mg BID Current orders for Inpatient glycemic control: Lantus 60 units BID, Novolog 0-9 units TID with meals  Inpatient Diabetes Program Recommendations: Insulin - Basal: Please consider decreasing Lantus to 50 units QHS. Correction (SSI): Please order Novolog bedtime correction scale. If patient is not eating well, may want to consider changing frequency to Q4H.  NOTE: Talked with patient over the phone to inquire if she was taking Lantus as prescribed. Patient states that she has NOT been taking Lantus 60 units BID recently due to other medical problems she has been having. Patient reports that she is not even taking Lantus once a day on some days. Patient states that she does not have much of an appetite and is having trouble swallowing.  Thanks, Barnie Alderman, RN, MSN, CDE Diabetes Coordinator Inpatient Diabetes Program [phone] (Team Pager from [place] to [place]) [phone] ([facility] office) [phone] [facility] office) [phone] [facility] office)

## 2015-12-10 NOTE — Progress Notes (Signed)
[facility]                                                                              Patient Demographics  Katherine Bullock, is a 70 y.o. female, DOB - 1946/04/07, YBW:[id number]  Admit date - 12/10/2015   Admitting Physician Toy Baker, MD  Outpatient Primary MD for the patient is [facility], MD  Outpatient specialists:   LOS - 1  days    No chief complaint on file.      Brief summary  Katherine Bullock is a 70 y.o. female with recent diagnosis of mediastinal tumor biopsy results are pending concerning for stage III lung cancer presents to the ER at [facility] with worsening pain and upper back and chest and shortness of breath last evening. EKG was showing T-wave changes in the lateral leads with patient's blood pressure more than 768 systolic. Labs including troponin were unremarkable. Patient was placed on nitroglycerin infusion for blood pressure control and chest pain.  CT angiography of the chest done shows - severe progression of mediastinal tumor since June of this year. Was showing 14 cm centrally necrotic tumor with epicenter in the posterior mediastinum to the right midline with severe mass effect on the central airways and right pulmonary artery. Tumor inseparable aorta and ventral thoracic spine. No spinal information suspected by CT.  Patient was transferred for further management. By the time patient arrived patient is off nitroglycerin drip and blood pressure has improved. Chest pain and upper back pain is controlled with pain relief medication. Not in distress.    Assessment & Plan    Principal Problem:   Acute respiratory failure with hypoxia (HCC) with chest pain and upper back pain most likely secondary to severe progression of mediastinal tumor - - CT of the chest showed progression of the mediastinal tumor, severe mass effect on the central airways and right pulmonary artery. - Continue pain control, discussed with Dr. Julien Nordmann, patient's  primary oncologist, recommended transfer to [facility] long [facility]. Dr. Julien Nordmann also discussed with Dr. Tammi Klippel with radiation oncology. Patient was supposed to have her first appointment with radiation oncology on 8/7.  - Will transfer to [facility] stepdown  Active Problems:   Back pain - Continue pain control, likely due to above    Hypertensive urgency - BP elevated likely due to pain, continue IV hydralazine prn, amlodipine, lisinopril and spironolactone    Diabetes mellitus type 2, controlled (HCC) - Continue sliding scale insulin and Lantus while inpatient  COPD - Mildly wheezing, placed on albuterol nebs  Code Status: Full CODE STATUS DVT Prophylaxis:  SCD's Family Communication: Discussed in detail with the patient, all imaging results, lab results explained to the patient and daughter at the bedside   Disposition Plan: Transfer to [facility], accepted by Dr. Sloan Leiter  Time Spent in minutes 25 minutes  Procedures:  CT angiogram of the chest  Consultants:   Oncology, Dr. Julien Nordmann Radiation onc  Antimicrobials:      Medications  Scheduled Meds: . albuterol  2.5 mg Nebulization Q6H  . amLODipine  5 mg Oral Daily  . aspirin  162 mg Oral Daily  .  atorvastatin  40 mg Oral Daily  . citalopram  40 mg Oral Daily  . feeding supplement (ENSURE ENLIVE)  237 mL Oral BID BM  . fenofibrate  160 mg Oral Daily  . hydrALAZINE  25 mg Oral TID  . insulin aspart  0-9 Units Subcutaneous TID WC  . insulin glargine  60 Units Subcutaneous BID  . lisinopril  20 mg Oral BID  . spironolactone  25 mg Oral Daily  . tiotropium  18 mcg Inhalation Daily   Continuous Infusions:  PRN Meds:.acetaminophen **OR** acetaminophen, hydrALAZINE, HYDROmorphone (DILAUDID) injection, ondansetron **OR** ondansetron (ZOFRAN) IV   Antibiotics   Anti-infectives    None        Subjective:   Katherine Bullock was seen and examined today.  At the time of examination, in significant chest and  back pain 8/10, had just received pain medication. +Shortness of breath with wheezing. No fevers or chills, no abdominal pain, nausea and vomiting.   Objective:   Vitals:   12/10/15 0240 12/10/15 0310 12/10/15 0355 12/10/15 0549  BP: (!) 150/70 (!) 160/76 (!) 165/73 (!) 150/63  Pulse: 74 74 77 70  Resp: (!) '23 15 19 '$ (!) 23  Temp:    98.2 F (36.8 C)  TempSrc:    Oral  SpO2: 96% 98% 98% 97%  Weight:      Height:        Intake/Output Summary (Last 24 hours) at 12/10/15 0946 Last data filed at 12/10/15 0115  Gross per 24 hour  Intake              250 ml  Output              400 ml  Net             -150 ml     Wt Readings from Last 3 Encounters:  12/10/15 56.7 kg (125 lb)  12/01/15 55.6 kg (122 lb 9.6 oz)  11/23/15 58.5 kg (129 lb)     Exam  General: Alert and oriented x 3, Frail and uncomfortable  HEENT:  PERRLA, EOMI, Anicteric Sclera, mucous membranes moist.   Neck: Supple, no JVD, no masses  Cardiovascular: S1 S2 auscultated, no rubs, murmurs or gallops. Regular rate and rhythm.  Respiratory: Decreased breath sounds with expiratory wheezing  Gastrointestinal: Soft, nontender, nondistended, + bowel sounds  Ext: no cyanosis clubbing or edema  Neuro: AAOx3, Cr N's II- XII. Strength 5/5 upper and lower extremities bilaterally  Skin: No rashes  Psych: Normal affect and demeanor, alert and oriented x3    Data Reviewed:  I have personally reviewed following labs and imaging studies  Micro Results No results found for this or any previous visit (from the past 240 hour(s)).  Radiology Reports Dg Chest 2 View  Result Date: 11/19/2015 CLINICAL DATA:  Preop for biopsy of right lung lesion EXAM: CHEST  2 VIEW COMPARISON:  Chest x-ray of 11/06/2015, PET-CT of 10/30/2015, and CT chest of 10/21/2005 FINDINGS: The right hilar and mediastinal mass noted on previous CT is unchanged by chest x-ray. No parenchymal infiltrate or pleural effusion is seen and no significant  atelectasis is noted. Mediastinal and hilar contours are unchanged and the heart is within normal limits in size. No bony abnormality is seen. IMPRESSION: No change in right hilar -mediastinal mass. No parenchymal infiltrate or pleural effusion is seen. Electronically Signed   By: Ivar Drape M.D.   On: 11/19/2015 11:51   Mr Jeri Cos VO Contrast  Result Date:  12/09/2015 CLINICAL DATA:  Lung cancer staging. EXAM: MRI HEAD WITHOUT AND WITH CONTRAST TECHNIQUE: Multiplanar, multiecho pulse sequences of the brain and surrounding structures were obtained without and with intravenous contrast. CONTRAST:  50m MULTIHANCE GADOBENATE DIMEGLUMINE 529 MG/ML IV SOLN COMPARISON:  MRI 10/12/2013 FINDINGS: Mild cerebral atrophy unchanged from the prior study. Negative for hydrocephalus Negative for acute infarct. Scattered white matter hyperintensities similar to the prior study and consistent with chronic microvascular ischemia. Chronic ischemic changes in the pons have progressed in the interval. Negative for intracranial hemorrhage.  No fluid collection. Negative for metastatic disease. No mass lesion or edema. Postcontrast imaging demonstrates no enhancing mass lesion. Incidental note is made of an enhancing small venous anomaly in the right parietal lobe. Normal enhancement of the dural venous sinuses. Paranasal sinuses clear. Normal orbital structures. Bilateral cataract extraction. Bilateral mastoid sinus effusion. IMPRESSION: Atrophy and chronic microvascular ischemic change.  No acute infarct Negative for metastatic disease to the brain Bilateral mastoid sinus effusion. Electronically Signed   By: CFranchot GalloM.D.   On: 12/09/2015 07:38    Lab Data:  CBC:  Recent Labs Lab 12/04/15 0846 12/10/15 0348  WBC 12.1* 9.9  NEUTROABS 8.8* 5.7  HGB 12.7 10.8*  HCT 38.3 34.2*  MCV 90.1 92.4  PLT 428* 3509  Basic Metabolic Panel:  Recent Labs Lab 12/04/15 0846 12/10/15 0348  NA 139 136  K 4.1 3.3*  CL   --  103  CO2 26 25  GLUCOSE 213* 209*  BUN 9.0 5*  CREATININE 0.9 0.69  CALCIUM 9.8 8.9   GFR: Estimated Creatinine Clearance: 56.5 mL/min (by C-G formula based on SCr of 0.8 mg/dL). Liver Function Tests:  Recent Labs Lab 12/04/15 0846 12/10/15 0348  AST 17 18  ALT 13 13*  ALKPHOS 116 91  BILITOT 0.59 0.1*  PROT 7.6 6.1*  ALBUMIN 3.2* 3.0*   No results for input(s): LIPASE, AMYLASE in the last 168 hours. No results for input(s): AMMONIA in the last 168 hours. Coagulation Profile: No results for input(s): INR, PROTIME in the last 168 hours. Cardiac Enzymes:  Recent Labs Lab 12/10/15 0348 12/10/15 0758  TROPONINI <0.03 <0.03   BNP (last 3 results) No results for input(s): PROBNP in the last 8760 hours. HbA1C: No results for input(s): HGBA1C in the last 72 hours. CBG:  Recent Labs Lab 12/10/15 0202 12/10/15 0748  GLUCAP 229* 88   Lipid Profile: No results for input(s): CHOL, HDL, LDLCALC, TRIG, CHOLHDL, LDLDIRECT in the last 72 hours. Thyroid Function Tests: No results for input(s): TSH, T4TOTAL, FREET4, T3FREE, THYROIDAB in the last 72 hours. Anemia Panel: No results for input(s): VITAMINB12, FOLATE, FERRITIN, TIBC, IRON, RETICCTPCT in the last 72 hours. Urine analysis: No results found for: COLORURINE, APPEARANCEUR, LABSPEC, PHURINE, GLUCOSEU, HGBUR, BILIRUBINUR, KETONESUR, PROTEINUR, UROBILINOGEN, NITRITE, LCorliss SkainsM.D. [facility] 12/10/2015, 9:46 AM  Pager: [phone] 7am to 7pm - call Pager - [phone]  After 7pm go to [email or web address] - password [email or web address]  Call night coverage person covering after 7pm

## 2015-12-10 NOTE — H&P (Signed)
History and Physical    Katherine Bullock MEQ:[id number] DOB: 1945/10/05 DOA: 12/10/2015  PCP: Marco Collie, MD  Patient coming from: [facility].  Chief Complaint: Chest pain shortness of breath and upper back pain.  HPI: Katherine Bullock is a 70 y.o. female with recent diagnosis of mediastinal tumor biopsy results are pending concerning for stage III lung cancer presents to the ER at [facility] with worsening pain and upper back and chest and shortness of breath last evening. EKG was showing T-wave changes in the lateral leads with patient's blood pressure more than 297 systolic. Labs including troponin were unremarkable. Patient was placed on nitroglycerin infusion for blood pressure control and chest pain. CT angiography of the chest done shows -   severe progression of mediastinal tumor since June of this year. Was showing 14 cm centrally necrotic tumor with epicenter in the posterior mediastinum to the right midline with severe mass effect on the central airways and right pulmonary artery. Tumor inseparable aorta and ventral thoracic spine. No spinal information suspected by CT.   Patient was transferred for further management. By the time patient arrived patient is off nitroglycerin drip and blood pressure has improved. Chest pain and upper back pain is controlled with pain relief medication. Not in distress.  ED Course: Patient was a direct admit.  Review of Systems: As per HPI, rest all negative.   Past Medical History:  Diagnosis Date  . Anxiety   . Arthritis   . Cataract   . Cholelithiases   . COPD (chronic obstructive pulmonary disease) ([place])   . Depression   . Diabetes mellitus   . Hyperlipidemia   . Hypertension   . Stroke [facility])     Past Surgical History:  Procedure Laterality Date  . ABDOMINAL HYSTERECTOMY    . APPENDECTOMY    . BLADDER SURGERY    . CATARACT EXTRACTION Bilateral   . CHOLECYSTECTOMY    . ENDOBRONCHIAL ULTRASOUND Bilateral 11/02/2015   Procedure: ENDOBRONCHIAL ULTRASOUND;  Surgeon: Javier Glazier, MD;  Location: [facility] ENDOSCOPY;  Service: Cardiopulmonary;  Laterality: Bilateral;  . MANDIBLE SURGERY    . MEDIASTINOSCOPY N/A 11/23/2015   Procedure: MEDIASTINOSCOPY;  Surgeon: Melrose Nakayama, MD;  Location: [facility];  Service: Thoracic;  Laterality: N/A;  . TUBAL LIGATION       reports that she has been smoking Cigarettes.  She started smoking about 50 years ago. She has a 50.00 pack-year smoking history. Her smokeless tobacco use includes Snuff. She reports that she does not drink alcohol or use drugs.  Allergies  Allergen Reactions  . Amitriptyline Swelling    Throat swelling patient thinks  . Flexeril [Cyclobenzaprine Hcl] Itching    Family History  Problem Relation Age of Onset  . Diabetes Mother   . Heart disease Mother   . Breast cancer Mother   . Hyperlipidemia Mother   . Hypertension Mother   . Heart attack Mother   . Heart disease Father   . Heart attack Father   . Hypertension Son   . Breast cancer Maternal Aunt   . Lung disease Neg Hx     Prior to Admission medications   Medication Sig Start Date End Date Taking? Authorizing Provider  amLODipine (NORVASC) 5 MG tablet Take 5 mg by mouth daily.    Historical Provider, MD  aspirin 81 MG tablet Take 162 mg by mouth daily.     Historical Provider, MD  atorvastatin (LIPITOR) 40 MG tablet Take 40 mg by mouth daily.  Historical Provider, MD  citalopram (CELEXA) 40 MG tablet Take 40 mg by mouth daily.    Historical Provider, MD  fenofibrate 160 MG tablet Take 160 mg by mouth daily.    Historical Provider, MD  glimepiride (AMARYL) 1 MG tablet Take 0.5 mg by mouth daily with breakfast.    Historical Provider, MD  hydrALAZINE (APRESOLINE) 25 MG tablet Take 25 mg by mouth 3 (three) times daily.    Historical Provider, MD  HYDROmorphone (DILAUDID) 2 MG tablet Take 0.5-1 tablets (1-2 mg total) by mouth every 6 (six) hours as needed for moderate pain or severe  pain. Patient not taking: Reported on 12/04/2015 11/23/15   Melrose Nakayama, MD  Insulin Glargine (LANTUS SOLOSTAR) 100 UNIT/ML Solostar Pen Inject 60 Units into the skin 2 (two) times daily.    Historical Provider, MD  lisinopril (PRINIVIL,ZESTRIL) 20 MG tablet Take 20 mg by mouth 2 (two) times daily.    Historical Provider, MD  metFORMIN (GLUCOPHAGE) 1000 MG tablet Take 1,000 mg by mouth 2 (two) times daily.    Historical Provider, MD  Multiple Vitamins-Minerals (ZINC PO) Take 1 tablet by mouth daily.    Historical Provider, MD  oxyCODONE-acetaminophen (PERCOCET) 5-325 MG tablet Take 1 tablet by mouth every 6 (six) hours as needed for severe pain. 12/03/15   Javier Glazier, MD  spironolactone (ALDACTONE) 25 MG tablet Take 25 mg by mouth daily.    Historical Provider, MD  Tiotropium Bromide Monohydrate (SPIRIVA RESPIMAT) 2.5 MCG/ACT AERS Inhale 2 puffs into the lungs every morning. 12/01/15   Javier Glazier, MD    Physical Exam: Vitals:   12/10/15 0109 12/10/15 0115  BP: (!) 174/90 (!) 141/52  Pulse: 79 73  Resp: (!) 24 (!) 31  Temp: 97.9 F (36.6 C)   TempSrc: Oral   SpO2: 99% 98%  Weight: 125 lb (56.7 kg)   Height: '5\' 4"'$  (1.626 m)       Constitutional: Not in distress. Vitals:   12/10/15 0109 12/10/15 0115  BP: (!) 174/90 (!) 141/52  Pulse: 79 73  Resp: (!) 24 (!) 31  Temp: 97.9 F (36.6 C)   TempSrc: Oral   SpO2: 99% 98%  Weight: 125 lb (56.7 kg)   Height: '5\' 4"'$  (1.626 m)    Eyes: Anicteric no pallor. ENMT: No discharge from the ears eyes nose and mouth. Neck: No JVD appreciated. Respiratory: No rhonchi or crepitations. Cardiovascular: S1-S2 heard. Abdomen: Soft nontender bowel sounds present. No guarding or rigidity. Musculoskeletal: No edema. Skin: No rash. Neurologic: Alert awake oriented to time place and person. Moves all extremities. Psychiatric: Appears normal.   Labs on Admission: I have personally reviewed following labs and imaging  studies  CBC:  Recent Labs Lab 12/04/15 0846  WBC 12.1*  NEUTROABS 8.8*  HGB 12.7  HCT 38.3  MCV 90.1  PLT 109*   Basic Metabolic Panel:  Recent Labs Lab 12/04/15 0846  NA 139  K 4.1  CO2 26  GLUCOSE 213*  BUN 9.0  CREATININE 0.9  CALCIUM 9.8   GFR: Estimated Creatinine Clearance: 50.2 mL/min (by C-G formula based on SCr of 0.9 mg/dL). Liver Function Tests:  Recent Labs Lab 12/04/15 0846  AST 17  ALT 13  ALKPHOS 116  BILITOT 0.59  PROT 7.6  ALBUMIN 3.2*   No results for input(s): LIPASE, AMYLASE in the last 168 hours. No results for input(s): AMMONIA in the last 168 hours. Coagulation Profile: No results for input(s): INR, PROTIME in the last  168 hours. Cardiac Enzymes: No results for input(s): CKTOTAL, CKMB, CKMBINDEX, TROPONINI in the last 168 hours. BNP (last 3 results) No results for input(s): PROBNP in the last 8760 hours. HbA1C: No results for input(s): HGBA1C in the last 72 hours. CBG:  Recent Labs Lab 12/10/15 0202  GLUCAP 229*   Lipid Profile: No results for input(s): CHOL, HDL, LDLCALC, TRIG, CHOLHDL, LDLDIRECT in the last 72 hours. Thyroid Function Tests: No results for input(s): TSH, T4TOTAL, FREET4, T3FREE, THYROIDAB in the last 72 hours. Anemia Panel: No results for input(s): VITAMINB12, FOLATE, FERRITIN, TIBC, IRON, RETICCTPCT in the last 72 hours. Urine analysis: No results found for: COLORURINE, APPEARANCEUR, LABSPEC, PHURINE, GLUCOSEU, HGBUR, BILIRUBINUR, KETONESUR, PROTEINUR, UROBILINOGEN, NITRITE, LEUKOCYTESUR Sepsis Labs: '@LABRCNTIP'$ (procalcitonin:4,lacticidven:4) )No results found for this or any previous visit (from the past 240 hour(s)).   Radiological Exams on Admission: Mr Jeri Cos NI Contrast  Result Date: 12/09/2015 CLINICAL DATA:  Lung cancer staging. EXAM: MRI HEAD WITHOUT AND WITH CONTRAST TECHNIQUE: Multiplanar, multiecho pulse sequences of the brain and surrounding structures were obtained without and with  intravenous contrast. CONTRAST:  52m MULTIHANCE GADOBENATE DIMEGLUMINE 529 MG/ML IV SOLN COMPARISON:  MRI 10/12/2013 FINDINGS: Mild cerebral atrophy unchanged from the prior study. Negative for hydrocephalus Negative for acute infarct. Scattered white matter hyperintensities similar to the prior study and consistent with chronic microvascular ischemia. Chronic ischemic changes in the pons have progressed in the interval. Negative for intracranial hemorrhage.  No fluid collection. Negative for metastatic disease. No mass lesion or edema. Postcontrast imaging demonstrates no enhancing mass lesion. Incidental note is made of an enhancing small venous anomaly in the right parietal lobe. Normal enhancement of the dural venous sinuses. Paranasal sinuses clear. Normal orbital structures. Bilateral cataract extraction. Bilateral mastoid sinus effusion. IMPRESSION: Atrophy and chronic microvascular ischemic change.  No acute infarct Negative for metastatic disease to the brain Bilateral mastoid sinus effusion. Electronically Signed   By: CFranchot GalloM.D.   On: 12/09/2015 07:38    [facility] with T-wave changes in the lateral leads.  Assessment/Plan Principal Problem:   Acute respiratory failure with hypoxia (HCC) Active Problems:   Back pain   Hypertensive urgency   Diabetes mellitus type 2, controlled ([facility]   Acute respiratory failure ([facility]    1. Acute respiratory failure with chest pain and upper back pain most likely secondary to severe progression of mediastinal tumor - at this time I have placed patient on Dilaudid for pain relief. Since patient did have some EKG changes we will cycle cardiac markers. Need to consult patient's oncologist in a.m. for further recommendations. Check 2-D echo. 2. Hypertensive urgency - blood pressure has improved at this time. Suspect most likely secondary to pain. Will keep patient on when necessary  IV hydralazine and continue amlodipine and lisinopril and spironolactone. 3. COPD - not actively wheezing. Continue inhalers. 4. Dyslipidemia on statins. 5. Diabetes mellitus type 2 - hold oral hypoglycemics while inpatient and continue Lantus. Patient is on 60 units twice a day. Closely follow CBGs since patient has poor oral intake.   DVT prophylaxis: SCDs. Code Status: Full code.  Family Communication: Patient's daughter.  Disposition Plan: To be determined. Consults called: None.  Admission status: Observation. Stepdown.    KRise PatienceMD [facility] Pager [phone]  If 7PM-7AM, please contact night-coverage [email or web address] Password [email or web address]  12/10/2015, 2:45 AM

## 2015-12-10 NOTE — Progress Notes (Signed)
Initial Nutrition Assessment  DOCUMENTATION CODES:   Severe malnutrition in context of chronic illness  INTERVENTION:    Chocolate Glucerna Shake PO TID, each supplement provides 220 kcal and 10 grams of protein  NUTRITION DIAGNOSIS:   Malnutrition related to chronic illness as evidenced by severe depletion of muscle mass, severe depletion of body fat, and 7% weight loss within one month.  GOAL:   Patient will meet greater than or equal to 90% of their needs  MONITOR:   PO intake, Supplement acceptance, I & O's  REASON FOR ASSESSMENT:   Malnutrition Screening Tool    ASSESSMENT:   70 y.o. female with recent diagnosis of mediastinal tumor biopsy results are pending concerning for stage III lung cancer presents to the ER at [facility] with worsening pain in upper back and chest and shortness of breath. CT angiography of the chest done shows severe progression of mediastinal tumor since June of this year.  Labs reviewed: potassium low. Medications reviewed and include novolog, lantus.  Patient reports difficulty swallowing for the past few weeks, she thinks due to cancer progression. She has been eating poorly and has lost ~7% of usual weight over the past month. Nutrition-Focused physical exam completed. Findings are severe fat depletion, severe muscle depletion, and no edema. Patient with severe PCM. She does not like Ensure or Boost supplements, but is willing to drink chocolate Glucerna.  Diet Order:  Diet heart healthy/carb modified Room service appropriate? Yes; Fluid consistency: Thin  Skin:  Reviewed, no issues  Last BM:  8/1  Height:   Ht Readings from Last 1 Encounters:  12/10/15 '5\' 4"'$  (1.626 m)    Weight:   Wt Readings from Last 1 Encounters:  12/10/15 125 lb (56.7 kg)    Ideal Body Weight:  54.5 kg  BMI:  Body mass index is 21.46 kg/m.  Estimated Nutritional Needs:   Kcal:  1800-2000  Protein:  85-100 gm  Fluid:  1.8-2 L  EDUCATION  NEEDS:   No education needs identified at this time  Molli Barrows, [place], [place], [place] Pager [phone] After Hours Pager [phone]

## 2015-12-10 NOTE — Progress Notes (Signed)
Pt received from [facility]. Full report received from [place], [place] there and [facility]. Pt alert and oriented x4, vitals charted WNL, in no apparent distress, informed about safety policy, call bell in reach. MD paged, Dr. Hillery Hunter to assess patient. Daughter at bedside and informed of plan.

## 2015-12-10 NOTE — Progress Notes (Signed)
Radiation Oncology         [phone] ________________________________  Initial Inpatient Consultation  Name: Katherine Bullock MRN: [id number]  Date: 12/10/2015  DOB: [birth year]  YP:PJKDTO,IZTI, MD  Marco Collie, MD   REFERRING PHYSICIAN: Marco Collie, MD  DIAGNOSIS: The encounter diagnosis was Lung mass.   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 female with a past medical history significant for multiple medical problems including history of COPD, anxiety, depression, diabetes mellitus, dyslipidemia, hypertension, stroke as well as osteoarthritis. The patient also has a long history of smoking. She has been complaining of shortness of breath and cough for the last 3 months. She was seen by her primary care physician and chest x-ray was performed on 10/19/2015 and it showed a prominent mass measuring approximately 6.7 x 5.7 cm in the retrohilar region on the right. This was suspicious for lung cancer. This was followed by CT scan of the chest on 10/22/2015 and it showed a nodule or mass in the right lower lobe posterior hilar region measuring at least 5.7 x 5.2 cm. The lesion extends into the posterior mediastinum posterior to the right main bronchus and subcarinal region. The lesion measured at least 8.0 cm craniocaudally. This is highly suspicious for malignancy. A PET scan was performed on 10/30/2015 and it showed a 8.0 cm central right lung mass invading the right hilum and mediastinum with an SUV max of 30.7. There was borderline enlarged right supraclavicular nodes, but no hypermetabolism. There was also a suspicious small malignant right pleural effusion with an SUV max of 3.5. There were no findings for metastatic disease involving the abdomen/pelvis or bony structures.  The patient was referred to Dr. Ashok Cordia and she underwent bronchoscopy with endobronchial ultrasound and needle aspiration on  11/02/15. The final pathology was not conclusive for malignancy, but it showed some atypical cells.  The patient was referred to Dr. Roxan Hockey and she underwent a mediastinoscopy on 11/23/2015. The preliminary report was suspicious for carcinoma, but the biopsy was sent for consultation and the final results are not available.  Dr. Roxan Hockey kindly referred the patient to Dr. Julien Nordmann on 12/04/15 for further evaluation and recommendations regarding treatment of her condition. He would make a decision regarding systemic treatment once final pathology from her biopsy is complete.  MRI of the brain on 12/09/15 was negative for metastatic disease.  The patient presented to the ER at [facility] earlier yesterday afternoon for worsening chest and upper back pain and shortness of breath. EKG at that time showed T-wave changes in the lateral leads with the patient's blood pressure more than 458 systolic. Labs including troponin were unremarkable. The patient was placed on nitroglycerin infusion for blood pressure control and chest pain. CT angiography of the chest showed severe progression of the mediastinal tumor since June of this year. The centrally necrotic tumor measures 14 cm with the epicenter in the posterior mediastinum to the right midline with severe mass effect on the central airways and right pulmonary artery. The tumor is inseparable from the aorta and ventral thoracic spine. The patient was transferred to [facility] to consult with myself about radiation for the management of her disease.  PREVIOUS RADIATION THERAPY: No  PAST MEDICAL HISTORY:  has a past medical history of Anxiety; Arthritis; Cataract; Cholelithiases; COPD (chronic obstructive pulmonary disease) ([place]); Depression; Diabetes mellitus; Hyperlipidemia; Hypertension; and Stroke [facility]).    PAST SURGICAL HISTORY:  Past Surgical History:  Procedure Laterality Date  . ABDOMINAL HYSTERECTOMY    . APPENDECTOMY    . BLADDER  SURGERY    . CATARACT EXTRACTION Bilateral   . CHOLECYSTECTOMY    . ENDOBRONCHIAL ULTRASOUND Bilateral 11/02/2015   Procedure: ENDOBRONCHIAL ULTRASOUND;  Surgeon: Javier Glazier, MD;  Location: [facility];  Service: Cardiopulmonary;  Laterality: Bilateral;  . MANDIBLE SURGERY    . MEDIASTINOSCOPY N/A 11/23/2015   Procedure: MEDIASTINOSCOPY;  Surgeon: Melrose Nakayama, MD;  Location: [facility] OR;  Service: Thoracic;  Laterality: N/A;  . TUBAL LIGATION      FAMILY HISTORY: family history includes Breast cancer in her maternal aunt and mother; Diabetes in her mother; Heart attack in her father and mother; Heart disease in her father and mother; Hyperlipidemia in her mother; Hypertension in her mother and son.  SOCIAL HISTORY:  reports that she has been smoking Cigarettes.  She started smoking about 50 years ago. She has a 50.00 pack-year smoking history. Her smokeless tobacco use includes Snuff. She reports that she does not drink alcohol or use drugs.  ALLERGIES: Amitriptyline and Flexeril [cyclobenzaprine hcl]  MEDICATIONS:  No current facility-administered medications for this encounter.    No current outpatient prescriptions on file.   Facility-Administered Medications Ordered in Other Encounters  Medication Dose Route Frequency Provider Last Rate Last Dose  . acetaminophen (TYLENOL) tablet 650 mg  650 mg Oral Q6H PRN Rise Patience, MD       Or  . acetaminophen (TYLENOL) suppository 650 mg  650 mg Rectal Q6H PRN Rise Patience, MD      . albuterol (PROVENTIL) (2.5 MG/3ML) 0.083% nebulizer solution 2.5 mg  2.5 mg Nebulization Q6H Ripudeep K Rai, MD   2.5 mg at 12/10/15 1627  . amLODipine (NORVASC) tablet 5 mg  5 mg Oral Daily Rise Patience, MD   5 mg at 12/10/15 0958  . aspirin chewable tablet 162 mg  162 mg Oral Daily Rise Patience, MD   162 mg at 12/10/15 0959  . atorvastatin (LIPITOR) tablet 40 mg  40 mg Oral Daily Rise Patience, MD   40 mg at 12/10/15  0959  . citalopram (CELEXA) tablet 40 mg  40 mg Oral Daily Rise Patience, MD   40 mg at 12/10/15 1000  . feeding supplement (GLUCERNA SHAKE) (GLUCERNA SHAKE) liquid 237 mL  237 mL Oral TID BM Rise Patience, MD      . fenofibrate tablet 160 mg  160 mg Oral Daily Rise Patience, MD   160 mg at 12/10/15 1000  . hydrALAZINE (APRESOLINE) injection 10 mg  10 mg Intravenous Q4H PRN Rise Patience, MD   10 mg at 12/10/15 1640  . hydrALAZINE (APRESOLINE) tablet 25 mg  25 mg Oral TID Rise Patience, MD   25 mg at 12/10/15 1640  . HYDROmorphone (DILAUDID) injection 0.5 mg  0.5 mg Intravenous Q3H PRN Rise Patience, MD   0.5 mg at 12/10/15 1717  . insulin aspart (novoLOG) injection 0-9 Units  0-9 Units Subcutaneous TID WC Rise Patience, MD   2 Units at 12/10/15 1721  . [START ON 12/11/2015] insulin glargine (LANTUS) injection 50 Units  50 Units Subcutaneous Daily Ripudeep K Rai, MD      . lisinopril (PRINIVIL,ZESTRIL) tablet 20 mg  20 mg Oral BID Rise Patience, MD   20 mg at 12/10/15 1001  . ondansetron (ZOFRAN) tablet 4 mg  4 mg Oral Q6H PRN Jaquita Rector  Aida Puffer, MD       Or  . ondansetron [facility]) injection 4 mg  4 mg Intravenous Q6H PRN Rise Patience, MD   4 mg at 12/10/15 1722  . spironolactone (ALDACTONE) tablet 25 mg  25 mg Oral Daily Rise Patience, MD   25 mg at 12/10/15 1001  . tiotropium (SPIRIVA) inhalation capsule 18 mcg  18 mcg Inhalation Daily Rise Patience, MD        REVIEW OF SYSTEMS:  A 15 point review of systems is documented in the electronic medical record. This was obtained by the nursing staff. However, I reviewed this with the patient to discuss relevant findings and make appropriate changes. Symptoms as noted above in the history of present illness. Mild  hemoptysis after her mediastinoscopy   PHYSICAL EXAM:  Vitals - 1 value per visit 08/09/5954  SYSTOLIC 387  DIASTOLIC 54  Pulse 82  Temperature 98  Respirations 24  Weight  (lb) 122.58  Height '5\' 4"'$   BMI 21.04  VISIT REPORT    General:Lying comfortably in her hospital bed,  Alert and oriented, in no acute distress, Appears tired, easily falls asleep during evaluation, accompanied by son and several other family members on evaluation this evening HEENT: Head is normocephalic. Extraocular movements are intact. Oropharynx is clear. Edentulous Neck: Neck is supple, no palpable cervical or supraclavicular lymphadenopathy. Heart: Regular in rate and rhythm with no murmurs, rubs, or gallops. Leads in place for heart monitoring Chest: Some inspiratory wheezing bilaterally. Good air movement throughout both lung fields Abdomen: Soft, nontender, nondistended, with no rigidity or guarding. Normal bowel sounds Extremities: No cyanosis or edema. Lymphatics: see Neck Exam Skin: No concerning lesions. Musculoskeletal: symmetric strength and muscle tone throughout. Neurologic:  No obvious focalities. Speech is fluent. Coordination is intact. Psychiatric: Judgment and insight are intact. Affect is appropriate.    ECOG = 3   LABORATORY DATA:  Lab Results  Component Value Date   WBC 9.9 12/10/2015   HGB 10.8 (L) 12/10/2015   HCT 34.2 (L) 12/10/2015   MCV 92.4 12/10/2015   PLT 390 12/10/2015   NEUTROABS 5.7 12/10/2015   Lab Results  Component Value Date   NA 136 12/10/2015   K 3.3 (L) 12/10/2015   CL 103 12/10/2015   CO2 25 12/10/2015   GLUCOSE 209 (H) 12/10/2015   CREATININE 0.69 12/10/2015   CALCIUM 8.9 12/10/2015      RADIOGRAPHY: Dg Chest 2 View  Result Date: 11/19/2015 CLINICAL DATA:  Preop for biopsy of right lung lesion EXAM: CHEST  2 VIEW COMPARISON:  Chest x-ray of 11/06/2015, PET-CT of 10/30/2015, and CT chest of 10/21/2005 FINDINGS: The right hilar and mediastinal mass noted on previous CT is unchanged by chest x-ray. No parenchymal infiltrate or pleural effusion is seen and no significant atelectasis is noted. Mediastinal and hilar contours are  unchanged and the heart is within normal limits in size. No bony abnormality is seen. IMPRESSION: No change in right hilar -mediastinal mass. No parenchymal infiltrate or pleural effusion is seen. Electronically Signed   By: Ivar Drape M.D.   On: 11/19/2015 11:51   Mr Jeri Cos FI Contrast  Result Date: 12/09/2015 CLINICAL DATA:  Lung cancer staging. EXAM: MRI HEAD WITHOUT AND WITH CONTRAST TECHNIQUE: Multiplanar, multiecho pulse sequences of the brain and surrounding structures were obtained without and with intravenous contrast. CONTRAST:  26m MULTIHANCE GADOBENATE DIMEGLUMINE 529 MG/ML IV SOLN COMPARISON:  MRI 10/12/2013 FINDINGS: Mild cerebral atrophy unchanged from the prior  study. Negative for hydrocephalus Negative for acute infarct. Scattered white matter hyperintensities similar to the prior study and consistent with chronic microvascular ischemia. Chronic ischemic changes in the pons have progressed in the interval. Negative for intracranial hemorrhage.  No fluid collection. Negative for metastatic disease. No mass lesion or edema. Postcontrast imaging demonstrates no enhancing mass lesion. Incidental note is made of an enhancing small venous anomaly in the right parietal lobe. Normal enhancement of the dural venous sinuses. Paranasal sinuses clear. Normal orbital structures. Bilateral cataract extraction. Bilateral mastoid sinus effusion. IMPRESSION: Atrophy and chronic microvascular ischemic change.  No acute infarct Negative for metastatic disease to the brain Bilateral mastoid sinus effusion. Electronically Signed   By: Franchot Gallo M.D.   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  The patient's tumor has progressed rapidly over the past few weeks and she is quite symptomatic. The patient is a candidate for urgent treatment directed at the large lung mass. I discussed the course of treatment side effects and potential  toxicities of radiation therapy in this situation with the patient and her family. She appears to understand wishes to proceed with planned course of treatment.  PLAN: Simulation and planning tomorrow morning at approximately 9 AM with first treatment likely Friday afternoon. Patient will resume her treatment on Monday, August 7 unless her situation worsens over the weekend requiring emergency treatment.  I anticipate between 10 and 14 treatments.    ------------------------------------------------  Blair Promise, PhD, MD  This document serves as a record of services personally performed by Gery Pray, MD. It was created on his behalf by Darcus Austin, a trained medical scribe. The creation of this record is based on the scribe's personal observations and the provider's statements to them. This document has been checked and approved by the attending provider.

## 2015-12-10 NOTE — Telephone Encounter (Signed)
Called 3W to see if the patient will be transferred to [facility].  Per her nurse, she is being transferred now.   Called Amy RT in CT SIM and confirmed that patient will be at [facility] for her 9 am CT [facility] appointment.

## 2015-12-10 NOTE — Progress Notes (Signed)
Echocardiogram 2D Echocardiogram has been performed.  Katherine Bullock 12/10/2015, 3:40 PM

## 2015-12-11 ENCOUNTER — Telehealth: Payer: Self-pay

## 2015-12-11 ENCOUNTER — Other Ambulatory Visit: Payer: PPO

## 2015-12-11 ENCOUNTER — Ambulatory Visit
Admit: 2015-12-11 | Discharge: 2015-12-11 | Disposition: A | Discharge status: Home / Self Care | Payer: PPO | Attending: Radiation Oncology | Admitting: Radiation Oncology

## 2015-12-11 ENCOUNTER — Encounter (HOSPITAL_COMMUNITY): Payer: Self-pay | Admitting: Family Medicine

## 2015-12-11 ENCOUNTER — Telehealth: Payer: Self-pay | Admitting: Oncology

## 2015-12-11 ENCOUNTER — Ambulatory Visit: Payer: PPO | Admitting: Internal Medicine

## 2015-12-11 DIAGNOSIS — E118 Type 2 diabetes mellitus with unspecified complications: Secondary | ICD-10-CM | POA: Diagnosis not present

## 2015-12-11 DIAGNOSIS — I248 Other forms of acute ischemic heart disease: Secondary | ICD-10-CM | POA: Diagnosis not present

## 2015-12-11 DIAGNOSIS — C801 Malignant (primary) neoplasm, unspecified: Secondary | ICD-10-CM | POA: Diagnosis not present

## 2015-12-11 DIAGNOSIS — Z8673 Personal history of transient ischemic attack (TIA), and cerebral infarction without residual deficits: Secondary | ICD-10-CM | POA: Diagnosis not present

## 2015-12-11 DIAGNOSIS — Z515 Encounter for palliative care: Secondary | ICD-10-CM | POA: Diagnosis not present

## 2015-12-11 DIAGNOSIS — F419 Anxiety disorder, unspecified: Secondary | ICD-10-CM | POA: Diagnosis not present

## 2015-12-11 DIAGNOSIS — M549 Dorsalgia, unspecified: Secondary | ICD-10-CM

## 2015-12-11 DIAGNOSIS — F329 Major depressive disorder, single episode, unspecified: Secondary | ICD-10-CM | POA: Diagnosis not present

## 2015-12-11 DIAGNOSIS — R0602 Shortness of breath: Secondary | ICD-10-CM

## 2015-12-11 DIAGNOSIS — E43 Unspecified severe protein-calorie malnutrition: Secondary | ICD-10-CM | POA: Diagnosis not present

## 2015-12-11 DIAGNOSIS — R079 Chest pain, unspecified: Secondary | ICD-10-CM

## 2015-12-11 DIAGNOSIS — Z6821 Body mass index (BMI) 21.0-21.9, adult: Secondary | ICD-10-CM | POA: Diagnosis not present

## 2015-12-11 DIAGNOSIS — R1310 Dysphagia, unspecified: Secondary | ICD-10-CM

## 2015-12-11 DIAGNOSIS — C3411 Malignant neoplasm of upper lobe, right bronchus or lung: Secondary | ICD-10-CM | POA: Diagnosis not present

## 2015-12-11 DIAGNOSIS — R042 Hemoptysis: Secondary | ICD-10-CM | POA: Diagnosis not present

## 2015-12-11 DIAGNOSIS — Z803 Family history of malignant neoplasm of breast: Secondary | ICD-10-CM | POA: Diagnosis not present

## 2015-12-11 DIAGNOSIS — K5900 Constipation, unspecified: Secondary | ICD-10-CM | POA: Diagnosis present

## 2015-12-11 DIAGNOSIS — M1990 Unspecified osteoarthritis, unspecified site: Secondary | ICD-10-CM | POA: Diagnosis not present

## 2015-12-11 DIAGNOSIS — I480 Paroxysmal atrial fibrillation: Secondary | ICD-10-CM | POA: Diagnosis not present

## 2015-12-11 DIAGNOSIS — Z794 Long term (current) use of insulin: Secondary | ICD-10-CM | POA: Diagnosis not present

## 2015-12-11 DIAGNOSIS — E785 Hyperlipidemia, unspecified: Secondary | ICD-10-CM | POA: Diagnosis not present

## 2015-12-11 DIAGNOSIS — I10 Essential (primary) hypertension: Secondary | ICD-10-CM | POA: Diagnosis not present

## 2015-12-11 DIAGNOSIS — R0600 Dyspnea, unspecified: Secondary | ICD-10-CM | POA: Diagnosis not present

## 2015-12-11 DIAGNOSIS — F17210 Nicotine dependence, cigarettes, uncomplicated: Secondary | ICD-10-CM | POA: Diagnosis not present

## 2015-12-11 DIAGNOSIS — I160 Hypertensive urgency: Secondary | ICD-10-CM | POA: Diagnosis not present

## 2015-12-11 DIAGNOSIS — Z510 Encounter for antineoplastic radiation therapy: Secondary | ICD-10-CM | POA: Diagnosis present

## 2015-12-11 DIAGNOSIS — I4891 Unspecified atrial fibrillation: Secondary | ICD-10-CM | POA: Diagnosis not present

## 2015-12-11 DIAGNOSIS — J910 Malignant pleural effusion: Secondary | ICD-10-CM | POA: Diagnosis not present

## 2015-12-11 DIAGNOSIS — E119 Type 2 diabetes mellitus without complications: Secondary | ICD-10-CM | POA: Diagnosis not present

## 2015-12-11 DIAGNOSIS — Z7189 Other specified counseling: Secondary | ICD-10-CM | POA: Diagnosis not present

## 2015-12-11 DIAGNOSIS — Z8249 Family history of ischemic heart disease and other diseases of the circulatory system: Secondary | ICD-10-CM | POA: Diagnosis not present

## 2015-12-11 DIAGNOSIS — M5440 Lumbago with sciatica, unspecified side: Secondary | ICD-10-CM | POA: Diagnosis not present

## 2015-12-11 DIAGNOSIS — J9601 Acute respiratory failure with hypoxia: Secondary | ICD-10-CM | POA: Diagnosis not present

## 2015-12-11 DIAGNOSIS — E8770 Fluid overload, unspecified: Secondary | ICD-10-CM | POA: Diagnosis not present

## 2015-12-11 DIAGNOSIS — E1121 Type 2 diabetes mellitus with diabetic nephropathy: Secondary | ICD-10-CM | POA: Diagnosis not present

## 2015-12-11 DIAGNOSIS — J449 Chronic obstructive pulmonary disease, unspecified: Secondary | ICD-10-CM | POA: Diagnosis not present

## 2015-12-11 LAB — GLUCOSE, CAPILLARY
GLUCOSE-CAPILLARY: 151 mg/dL — AB (ref 65–99)
Glucose-Capillary: 144 mg/dL — ABNORMAL HIGH (ref 65–99)
Glucose-Capillary: 209 mg/dL — ABNORMAL HIGH (ref 65–99)

## 2015-12-11 MED ORDER — ENOXAPARIN SODIUM 40 MG/0.4ML ~~LOC~~ SOLN
40.0000 mg | SUBCUTANEOUS | Status: DC
  Administered 2015-12-12 – 2015-12-18 (×7): 40 mg via SUBCUTANEOUS
  Filled 2015-12-11 (×7): qty 0.4

## 2015-12-11 MED ORDER — HYDROMORPHONE HCL 1 MG/ML IJ SOLN
0.5000 mg | INTRAMUSCULAR | Status: DC | PRN
  Administered 2015-12-11 – 2015-12-13 (×17): 0.5 mg via INTRAVENOUS
  Filled 2015-12-11 (×17): qty 1

## 2015-12-11 MED ORDER — DILTIAZEM HCL 25 MG/5ML IV SOLN: 5.0000 mg | Freq: Once | INTRAVENOUS | Status: DC | PRN

## 2015-12-11 MED ORDER — AMLODIPINE BESYLATE 10 MG PO TABS: 10.0000 mg | ORAL_TABLET | Freq: Every day | ORAL | Status: DC

## 2015-12-11 MED ORDER — DILTIAZEM HCL 25 MG/5ML IV SOLN
5.0000 mg | Freq: Once | INTRAVENOUS | Status: AC
  Administered 2015-12-11: 5 mg via INTRAVENOUS
  Filled 2015-12-11: qty 5

## 2015-12-11 MED ORDER — DILTIAZEM HCL 25 MG/5ML IV SOLN
5.0000 mg | INTRAVENOUS | Status: DC | PRN
  Filled 2015-12-11: qty 5

## 2015-12-11 MED ORDER — SODIUM CHLORIDE 0.9 % IV SOLN
INTRAVENOUS | Status: DC
  Administered 2015-12-11: 17:00:00 via INTRAVENOUS

## 2015-12-11 MED ORDER — SODIUM CHLORIDE 0.9 % IV BOLUS (SEPSIS)
500.0000 mL | Freq: Once | INTRAVENOUS | Status: AC
  Administered 2015-12-11: 500 mL via INTRAVENOUS

## 2015-12-11 MED ORDER — CITALOPRAM HYDROBROMIDE 20 MG PO TABS
20.0000 mg | ORAL_TABLET | Freq: Every day | ORAL | Status: DC
  Administered 2015-12-12 – 2015-12-18 (×7): 20 mg via ORAL
  Filled 2015-12-11 (×7): qty 1

## 2015-12-11 MED ORDER — DOCUSATE SODIUM 100 MG PO CAPS
100.0000 mg | ORAL_CAPSULE | Freq: Two times a day (BID) | ORAL | Status: DC
  Administered 2015-12-11 – 2015-12-16 (×12): 100 mg via ORAL
  Filled 2015-12-11 (×12): qty 1

## 2015-12-11 NOTE — Progress Notes (Signed)
   Department of Radiation Oncology  Phone:  [phone] Fax:        [phone]     CHART NOTE:  I met with the patient and her family this afternoon as they considered whether to proceed with planned radiation.  The patient and her family were leaning toward comfort care.  They had some very real concerns regarding the patient's serious condition at age 70 with a large mediastinal tumor.  They also had some misconceptions about treatment and the extent of disease.  I spend time with the patient and her family reviewing her June CT, and June PET showing the large solitary tumor, and then compared it to the Aug CT showing significant growth compressing the airways.  I dispelled some concerns about a palliative course of radiation indicating that it would not likely cause any pain.  I also explained that the rapid growth rate of the tumor sometimes portends radiation sensitivity.  I explained that a few painless radiation treatments may induce tumor regression, alleviating her respiratory distress.  After a discussion, I left the family to consider their options.  They elected to proceed with radiation treatments.  I counseled them that tumor regression may be rapid or slow.  If we see slow regression, we may be faced in the next few days with a difficult decision about intubation and mechanical ventilation.  In this unique instance where we have one solitary large tumor causing narrowing of central airways, that temporary ventilator support may be warranted.  However, the family expressed a strong desire to avoid intubation, which is understandable.  Patient received one fraction of radiation treatment uneventfully.  The radiation oncology team will present to deliver a second induction dose tomorrow, Saturday 8/5 morning.  After that, the plan is to reassess and potentially continue consolidative radiation treatment Monday.  This case is very challenging and I appreciate the high quality  care and support provided by the hospitalist service and ICU nurses as we navigate the next few days.   ------------------------------------------------   Tyler Pita, MD [place] Director and Director of Stereotactic Radiosurgery Direct Dial: [phone]  Fax: [phone] [email or web address]  Skype  LinkedIn

## 2015-12-11 NOTE — Telephone Encounter (Signed)
Dr. Tammi Klippel spoke with the physician caring for [name], as of right now the plan is to treat her today.

## 2015-12-11 NOTE — Telephone Encounter (Signed)
Roland Earl, RN in ICU Stepdown.  Sheza is OK to have her CT SIM today.  She can travel by bed.  Notified Nicole RT in [place] SIM.

## 2015-12-11 NOTE — Progress Notes (Signed)
Subjective: The patient is seen and examined today. Her daughter was at the bedside. This is a very pleasant 70 years old white female with likely stage IIIa lung cancer, pending final pathology report. The patient was seen less than 2 weeks ago for evaluation of her disease but the size of the mass have increased significantly in the last 2 weeks and the patient presented with significant shortness of breath as well as difficulty swallowing. The final pathology is still pending but there is a discussion with the pathologist, it doesn't look like lymphoma or small cell lung cancer. 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 The specimen was sent for consultation and the final result is a still pending. She was admitted for evaluation and management of her progressive disease. She denied having any fever or chills.  Objective: Vital signs in last 24 hours: Temp:  [98 F (36.7 C)-98.7 F (37.1 C)] 98.3 F (36.8 C) (08/04 0400) Pulse Rate:  [74-84] 81 (08/04 0800) Resp:  [12-24] 21 (08/04 0800) BP: (131-177)/(47-101) 138/47 (08/04 0800) SpO2:  [95 %-100 %] 95 % (08/04 0800) Weight:  [122 lb 9.2 oz (55.6 kg)-122 lb 12.7 oz (55.7 kg)] 122 lb 12.7 oz (55.7 kg) (08/04 0500)  Intake/Output from previous day: 08/03 0701 - 08/04 0700 In: 440 [P.O.:440] Out: 250 [Urine:250] Intake/Output this shift: No intake/output data recorded.  General appearance: alert, cooperative, fatigued and mild distress Resp: wheezes RLL and RML Cardio: regular rate and rhythm, S1, S2 normal, no murmur, click, rub or gallop GI: soft, non-tender; bowel sounds normal; no masses,  no organomegaly Extremities: extremities normal, atraumatic, no cyanosis or edema  Lab Results:   Recent Labs  12/10/15 0348  WBC 9.9  HGB 10.8*  HCT 34.2*  PLT 390   BMET  Recent Labs  12/10/15 0348  NA 136  K 3.3*  CL 103  CO2 25  GLUCOSE 209*  BUN 5*  CREATININE 0.69  CALCIUM 8.9    Studies/Results: No  results found.  Medications: I have reviewed the patient's current medications.  Assessment/Plan: This is a very pleasant 70 years old white female with significantly progressive high-grade malignancy presenting with large central right lung mass invading the right hilum and mediastinum was borderline enlarged right supraclavicular nodes and probably small malignant right pleural effusion. Unfortunately the final pathology is a still pending but the patient is very symptomatic with significant shortness of breath as well as dysphagia. I had a lengthy discussion with the patient and her daughter today about her condition. I recommended for her to proceed with the palliative radiotherapy as prescribed by Dr. Sondra Come. Once the final pathology is available, I may consider the patient for palliative systemic chemotherapy. We will continue her supportive care for now. Thank you so much for taking good care of Ms. Treadwell. I will continue to follow the patient with you and assist in her management an as-needed basis.  LOS: 1 day    Tymel Conely K. 12/11/2015

## 2015-12-11 NOTE — Progress Notes (Signed)
  Radiation Oncology         [phone] ________________________________  Name: Katherine Bullock MRN: [id number]  Date: 12/11/2015  DOB: March 09, 1946  INPATIENT SIMULATION AND TREATMENT PLANNING NOTE    ICD-9-CM ICD-10-CM   1. Primary cancer of right upper lobe of lung (HCC) 162.3 C34.11     DIAGNOSIS:  70 yo woman with putative stage IIIA (T3, N2, M0) primary lung cancer, pending tissue confirmation  NARRATIVE:  The patient was brought to the [place].  Identity was confirmed.  All relevant records and images related to the planned course of therapy were reviewed.  The patient freely provided informed written consent to proceed with treatment after reviewing the details related to the planned course of therapy. The consent form was witnessed and verified by the simulation staff.  Then, the patient was set-up in a stable reproducible  supine position for radiation therapy.  CT images were obtained.  Surface markings were placed.  The CT images were loaded into the planning software.  Then the target and avoidance structures were contoured.  Treatment planning then occurred.  The radiation prescription was entered and confirmed.  Then, I designed and supervised the construction of a total of 2 medically necessary complex treatment devices in the form of MLCs to shield critical lung and heart from exposure.  I have requested : Isodose Plan.  I have ordered:Nutrition Consult  PLAN:  The patient will receive 8 Gy in 2 fractions on an emergency basis today and tomorrow, then, continue conventional fractionation next week.  It is possible given the extent of central airway (carina/bilateral MSBs) compression, this patient may require temporary ventilator support as radiation begins to induce tumor regression.  In this setting, intubation and mechanical ventilation are warranted rather than DNR given the high probabllity of tumor response and subsequent successful  extubation.      ________________________________  Sheral Apley Tammi Klippel, M.D.

## 2015-12-11 NOTE — Telephone Encounter (Signed)
I called the nursing station on [facility] ICU where the patient is, to inquire if the patient was still able to come for simulation today. I spoke with her nurse who tells me that she has just had a change in status in the last 5-10 minutes and has gone into A-fib. The nurse asks for time to work with her to help her with this situation before she comes for simulation. I called [employer]) and informed them of this situation. They will contact Dr. Tammi Klippel.

## 2015-12-11 NOTE — Progress Notes (Addendum)
[facility]                                                                              Patient Demographics  Katherine Bullock, is a 70 y.o. female, DOB - Sep 22, 1945, KTG:[id number]  Admit date - 12/10/2015   Admitting Physician Toy Baker, MD  Outpatient Primary MD for the patient is [facility], MD  Outpatient specialists:   LOS - 1  days   No chief complaint on file.      Brief summary  Katherine Bullock is a 70 y.o. female with recent diagnosis of mediastinal tumor biopsy results are pending concerning for stage III lung cancer presents to the ER at [facility] with worsening pain and upper back and chest and shortness of breath last evening. EKG was showing T-wave changes in the lateral leads with patient's blood pressure more than 428 systolic. Labs including troponin were unremarkable. Patient was placed on nitroglycerin infusion for blood pressure control and chest pain.  CT angiography of the chest done shows - severe progression of mediastinal tumor since June of this year. Was showing 14 cm centrally necrotic tumor with epicenter in the posterior mediastinum to the right midline with severe mass effect on the central airways and right pulmonary artery. Tumor inseparable aorta and ventral thoracic spine. No spinal information suspected by CT.  Patient was transferred for further management. By the time patient arrived patient is off nitroglycerin drip and blood pressure has improved. Chest pain and upper back pain is controlled with pain relief medication. Not in distress.  Assessment & Plan    Acute respiratory failure with hypoxia with chest pain and upper back pain most likely secondary to severe progression of mediastinal tumor - - CT of the chest showed progression of the mediastinal tumor, severe mass effect on the central airways and right pulmonary artery. - Continue pain control, discussed with Dr. Julien Nordmann, patient's primary oncologist, recommended  transfer to [facility] long hospital. Dr. Julien Nordmann also discussed with Dr. Tammi Klippel with radiation oncology. Patient was supposed to have her first appointment with radiation oncology on 8/7.  - Continue to monitor in stepdown, intensify pain management - Pt to go for radiation evaluation and treatment later today with radiation oncologist   Back pain / Chest Pain - Intensify pain management to get better control.    Hypertensive urgency - BP elevated due to pain, continue IV hydralazine prn, amlodipine, lisinopril and spironolactone    Diabetes mellitus type 2, controlled (HCC) - Continue sliding scale insulin and Lantus while inpatient  COPD - continue albuterol nebs  Code Status: Full CODE STATUS DVT Prophylaxis:  SCD's Family Communication: Discussed in detail with the patient, all imaging results, lab results explained to the patient and daughter at the bedside   Disposition Plan: TBD  Time Spent in minutes 25 minutes  Procedures:  CT angiogram of the chest  Consultants:   Oncology, Dr. Julien Nordmann Radiation onc  Antimicrobials:  Anti-infectives    None      Medications  Scheduled Meds: . albuterol  2.5 mg Nebulization TID  . amLODipine  5 mg Oral Daily  . aspirin  162 mg  Oral Daily  . atorvastatin  40 mg Oral Daily  . citalopram  40 mg Oral Daily  . feeding supplement (GLUCERNA SHAKE)  237 mL Oral TID BM  . fenofibrate  160 mg Oral Daily  . hydrALAZINE  25 mg Oral TID  . insulin aspart  0-9 Units Subcutaneous TID WC  . insulin glargine  50 Units Subcutaneous Daily  . lisinopril  20 mg Oral BID  . spironolactone  25 mg Oral Daily  . tiotropium  18 mcg Inhalation Daily   Continuous Infusions:  PRN Meds:.acetaminophen **OR** acetaminophen, hydrALAZINE, HYDROmorphone (DILAUDID) injection, ondansetron **OR** ondansetron (ZOFRAN) IV   Antibiotics   Anti-infectives    None      Subjective:   Katherine Bullock reports that pain medicine wears off rapidly after about 2  hours.  No BM reported. No SOB.   Objective:   Vitals:   12/11/15 0200 12/11/15 0400 12/11/15 0500 12/11/15 0600  BP: (!) 137/56 (!) 131/47  (!) 177/70  Pulse: 74 76  74  Resp: '14 12  16  '$ Temp:  98.3 F (36.8 C)    TempSrc:  Oral    SpO2: 98% 97%  98%  Weight:   55.7 kg (122 lb 12.7 oz)   Height:        Intake/Output Summary (Last 24 hours) at 12/11/15 0724 Last data filed at 12/11/15 0000  Gross per 24 hour  Intake              440 ml  Output              250 ml  Net              190 ml     Wt Readings from Last 3 Encounters:  12/11/15 55.7 kg (122 lb 12.7 oz)  12/01/15 55.6 kg (122 lb 9.6 oz)  11/23/15 58.5 kg (129 lb)   Exam  General: Alert and oriented x 3, Frail and in no distress  HEENT:  PERRLA, EOMI, Anicteric Sclera, mucous membranes moist.   Neck: Supple, no JVD, no masses   Cardiovascular: S1 S2 auscultated, no rubs, murmurs or gallops. Regular rate and rhythm.  Respiratory: Decreased breath sounds with rare expiratory wheezing  Gastrointestinal: Soft, nontender, nondistended, + bowel sounds  Ext: no cyanosis clubbing or edema  Neuro: AAOx3, Cr N's II- XII. Strength 5/5 upper and lower extremities bilaterally  Skin: No rashes  Psych: Normal affect and demeanor, alert and oriented x3   Data Reviewed:  I have personally reviewed following labs and imaging studies  Micro Results Recent Results (from the past 240 hour(s))  MRSA PCR Screening     Status: None   Collection Time: 12/10/15  4:00 PM  Result Value Ref Range Status   MRSA by PCR NEGATIVE NEGATIVE Final    Comment:        The GeneXpert MRSA Assay (FDA approved for NASAL specimens only), is one component of a comprehensive MRSA colonization surveillance program. It is not intended to diagnose MRSA infection nor to guide or monitor treatment for MRSA infections.     Radiology Reports Dg Chest 2 View  Result Date: 11/19/2015 CLINICAL DATA:  Preop for biopsy of right lung lesion  EXAM: CHEST  2 VIEW COMPARISON:  Chest x-ray of 11/06/2015, PET-CT of 10/30/2015, and CT chest of 10/21/2005 FINDINGS: The right hilar and mediastinal mass noted on previous CT is unchanged by chest x-ray. No parenchymal infiltrate or pleural effusion is seen and no significant  atelectasis is noted. Mediastinal and hilar contours are unchanged and the heart is within normal limits in size. No bony abnormality is seen. IMPRESSION: No change in right hilar -mediastinal mass. No parenchymal infiltrate or pleural effusion is seen. Electronically Signed   By: Ivar Drape M.D.   On: 11/19/2015 11:51   Mr Jeri Cos IO Contrast  Result Date: 12/09/2015 CLINICAL DATA:  Lung cancer staging. EXAM: MRI HEAD WITHOUT AND WITH CONTRAST TECHNIQUE: Multiplanar, multiecho pulse sequences of the brain and surrounding structures were obtained without and with intravenous contrast. CONTRAST:  3m MULTIHANCE GADOBENATE DIMEGLUMINE 529 MG/ML IV SOLN COMPARISON:  MRI 10/12/2013 FINDINGS: Mild cerebral atrophy unchanged from the prior study. Negative for hydrocephalus Negative for acute infarct. Scattered white matter hyperintensities similar to the prior study and consistent with chronic microvascular ischemia. Chronic ischemic changes in the pons have progressed in the interval. Negative for intracranial hemorrhage.  No fluid collection. Negative for metastatic disease. No mass lesion or edema. Postcontrast imaging demonstrates no enhancing mass lesion. Incidental note is made of an enhancing small venous anomaly in the right parietal lobe. Normal enhancement of the dural venous sinuses. Paranasal sinuses clear. Normal orbital structures. Bilateral cataract extraction. Bilateral mastoid sinus effusion. IMPRESSION: Atrophy and chronic microvascular ischemic change.  No acute infarct Negative for metastatic disease to the brain Bilateral mastoid sinus effusion. Electronically Signed   By: CFranchot GalloM.D.   On: 12/09/2015 07:38   Lab  Data:  CBC:  Recent Labs Lab 12/04/15 0846 12/10/15 0348  WBC 12.1* 9.9  NEUTROABS 8.8* 5.7  HGB 12.7 10.8*  HCT 38.3 34.2*  MCV 90.1 92.4  PLT 428* 3962  Basic Metabolic Panel:  Recent Labs Lab 12/04/15 0846 12/10/15 0348  NA 139 136  K 4.1 3.3*  CL  --  103  CO2 26 25  GLUCOSE 213* 209*  BUN 9.0 5*  CREATININE 0.9 0.69  CALCIUM 9.8 8.9   GFR: Estimated Creatinine Clearance: 56.5 mL/min (by C-G formula based on SCr of 0.8 mg/dL). Liver Function Tests:  Recent Labs Lab 12/04/15 0846 12/10/15 0348  AST 17 18  ALT 13 13*  ALKPHOS 116 91  BILITOT 0.59 0.1*  PROT 7.6 6.1*  ALBUMIN 3.2* 3.0*   No results for input(s): LIPASE, AMYLASE in the last 168 hours. No results for input(s): AMMONIA in the last 168 hours. Coagulation Profile: No results for input(s): INR, PROTIME in the last 168 hours. Cardiac Enzymes:  Recent Labs Lab 12/10/15 0348 12/10/15 0758 12/10/15 1442  TROPONINI <0.03 <0.03 <0.03   BNP (last 3 results) No results for input(s): PROBNP in the last 8760 hours. HbA1C: No results for input(s): HGBA1C in the last 72 hours. CBG:  Recent Labs Lab 12/10/15 0202 12/10/15 0748 12/10/15 1154 12/10/15 1717 12/10/15 2117  GLUCAP 229* 88 102* 181* 151*   Lipid Profile: No results for input(s): CHOL, HDL, LDLCALC, TRIG, CHOLHDL, LDLDIRECT in the last 72 hours. Thyroid Function Tests: No results for input(s): TSH, T4TOTAL, FREET4, T3FREE, THYROIDAB in the last 72 hours. Anemia Panel: No results for input(s): VITAMINB12, FOLATE, FERRITIN, TIBC, IRON, RETICCTPCT in the last 72 hours. Urine analysis: No results found for: COLORURINE, APPEARANCEUR, LABSPEC, PHURINE, GLUCOSEU, HGBUR, BILIRUBINUR, KETONESUR, PROTEINUR, UROBILINOGEN, NITRITE, LEUKOCYTESUR   Critical Care Time Spent:  481mins   Katherine Bullock M.D. [facility] 12/11/2015, 7:24 AM  Pager: [phone] 7am to 7pm - call Pager - [phone] After 7pm go to  [email or web address] - password [email or web address] Call  night coverage person covering after 7pm

## 2015-12-12 ENCOUNTER — Ambulatory Visit
Admit: 2015-12-12 | Discharge: 2015-12-12 | Disposition: A | Discharge status: Home / Self Care | Payer: PPO | Attending: Radiation Oncology | Admitting: Radiation Oncology

## 2015-12-12 DIAGNOSIS — I4891 Unspecified atrial fibrillation: Secondary | ICD-10-CM

## 2015-12-12 LAB — CBC
HEMATOCRIT: 31.2 % — AB (ref 36.0–46.0)
HEMOGLOBIN: 10.2 g/dL — AB (ref 12.0–15.0)
MCH: 30.4 pg (ref 26.0–34.0)
MCHC: 32.7 g/dL (ref 30.0–36.0)
MCV: 92.9 fL (ref 78.0–100.0)
PLATELETS: 490 10*3/uL — AB (ref 150–400)
RBC: 3.36 MIL/uL — ABNORMAL LOW (ref 3.87–5.11)
RDW: 13.9 % (ref 11.5–15.5)
WBC: 14.1 10*3/uL — ABNORMAL HIGH (ref 4.0–10.5)

## 2015-12-12 LAB — BASIC METABOLIC PANEL
ANION GAP: 7 (ref 5–15)
BUN: 12 mg/dL (ref 6–20)
CHLORIDE: 106 mmol/L (ref 101–111)
CO2: 24 mmol/L (ref 22–32)
CREATININE: 0.63 mg/dL (ref 0.44–1.00)
Calcium: 8.8 mg/dL — ABNORMAL LOW (ref 8.9–10.3)
GFR calc non Af Amer: >60 mL/min (ref >60)
Glucose, Bld: 85 mg/dL (ref 65–99)
POTASSIUM: 3.9 mmol/L (ref 3.5–5.1)
SODIUM: 137 mmol/L (ref 135–145)

## 2015-12-12 LAB — GLUCOSE, CAPILLARY
GLUCOSE-CAPILLARY: 112 mg/dL — AB (ref 65–99)
GLUCOSE-CAPILLARY: 218 mg/dL — AB (ref 65–99)
GLUCOSE-CAPILLARY: 64 mg/dL — AB (ref 65–99)
Glucose-Capillary: 94 mg/dL (ref 65–99)
Glucose-Capillary: 63 mg/dL — ABNORMAL LOW (ref 65–99)
Glucose-Capillary: 171 mg/dL — ABNORMAL HIGH (ref 65–99)

## 2015-12-12 MED ORDER — LISINOPRIL 10 MG PO TABS
5.0000 mg | ORAL_TABLET | Freq: Every day | ORAL | Status: DC
  Administered 2015-12-12 – 2015-12-18 (×7): 5 mg via ORAL
  Filled 2015-12-12: qty 1
  Filled 2015-12-12: qty 2
  Filled 2015-12-12 (×5): qty 1

## 2015-12-12 MED ORDER — AMLODIPINE BESYLATE 5 MG PO TABS
5.0000 mg | ORAL_TABLET | Freq: Every day | ORAL | Status: DC
  Administered 2015-12-12 – 2015-12-18 (×7): 5 mg via ORAL
  Filled 2015-12-12 (×7): qty 1

## 2015-12-12 MED ORDER — DEXTROSE-NACL 5-0.9 % IV SOLN: INTRAVENOUS | Status: DC

## 2015-12-12 MED ORDER — DILTIAZEM HCL ER COATED BEADS 120 MG PO CP24
120.0000 mg | ORAL_CAPSULE | Freq: Every day | ORAL | Status: DC
  Administered 2015-12-12 – 2015-12-13 (×2): 120 mg via ORAL
  Filled 2015-12-12 (×2): qty 1

## 2015-12-12 MED ORDER — SPIRONOLACTONE 25 MG PO TABS
12.5000 mg | ORAL_TABLET | Freq: Every day | ORAL | Status: DC
  Administered 2015-12-12 – 2015-12-18 (×7): 12.5 mg via ORAL
  Filled 2015-12-12 (×7): qty 1

## 2015-12-12 MED ORDER — LISINOPRIL 10 MG PO TABS: 10.0000 mg | ORAL_TABLET | Freq: Every day | ORAL | Status: DC

## 2015-12-12 MED ORDER — SODIUM CHLORIDE 0.9 % IV SOLN
INTRAVENOUS | Status: DC
  Administered 2015-12-12: 13:00:00 via INTRAVENOUS

## 2015-12-12 MED ORDER — METOPROLOL SUCCINATE ER 25 MG PO TB24: 12.5000 mg | ORAL_TABLET | Freq: Every day | ORAL | Status: DC

## 2015-12-12 MED ORDER — LISINOPRIL 10 MG PO TABS: 20.0000 mg | ORAL_TABLET | Freq: Every day | ORAL | Status: DC

## 2015-12-12 NOTE — Progress Notes (Addendum)
[facility]                                                                              Patient Demographics  Katherine Bullock, is a 70 y.o. female, DOB - November 29, 1945, JQB:[id number]  Admit date - 12/10/2015   Admitting Physician Toy Baker, MD  Outpatient Primary MD for the patient is [facility], [place]  Outpatient specialists:   LOS - 2  days   No chief complaint on file.     Brief summary  Katherine Bullock is a 70 y.o. female with recent diagnosis of mediastinal tumor biopsy results are pending concerning for stage III lung cancer presents to the ER at [facility] with worsening pain and upper back and chest and shortness of breath last evening. EKG was showing T-wave changes in the lateral leads with patient's blood pressure more than 409 systolic. Labs including troponin were unremarkable. Patient was placed on nitroglycerin infusion for blood pressure control and chest pain.  CT angiography of the chest done shows - severe progression of mediastinal tumor since June of this year. Was showing 14 cm centrally necrotic tumor with epicenter in the posterior mediastinum to the right midline with severe mass effect on the central airways and right pulmonary artery. Tumor inseparable aorta and ventral thoracic spine. No spinal information suspected by CT.  Patient was transferred for further management. By the time patient arrived patient is off nitroglycerin drip and blood pressure has improved. Chest pain and upper back pain is controlled with pain relief medication. Not in distress.  Assessment & Plan    Acute respiratory failure with hypoxia with chest pain and upper back pain most likely secondary to severe progression of mediastinal tumor - - CT of the chest showed progression of the mediastinal tumor, severe mass effect on the central airways and right pulmonary artery. - Continue pain control, discussed with Dr. Julien Nordmann, patient's primary oncologist, recommended  transfer to [facility] long hospital. Dr. Julien Nordmann also discussed with Dr. Tammi Klippel with radiation oncology. Patient was supposed to have her first appointment with radiation oncology on 8/7.  - Continue to monitor in stepdown, intensify pain management - Pt had first radiation treatment 8/4, planning to have a second emergency treatment 8/5   Back pain / Chest Pain - Pain controlled with current IV med regimen.    Hypertensive urgency - resolved now. - suspect blood pressure was elevated secondary to severe uncontrolled pain. Now that pain is controlled, BP has been much better.  New Onset Atrial Fibrillation - likely precipitated by tumor pressing on heart, responded to IV diltiazem bolus.   - diltiazem daily ordered.   - echo completed  - check TSH - spoke with cardiology, would not anticoagulate at this time given she is is currently undergoing aggressive radiation therapy and suspect subsequent tumor necrosis, monitor for recurrence of afib on tele, follow, will let them know if it recurs.   Diabetes mellitus type 2, controlled ([place]) - Pt having some lower BS readings because not eating, changed IVF to D5NS at 75 ml/hr.   COPD - continue albuterol nebs  Code Status: Full CODE STATUS DVT Prophylaxis:  SCD's Family Communication: Discussed in detail with the patient, all imaging results, lab results explained to the patient and daughter at the bedside  Disposition Plan: TBD  Procedures:  CT angiogram of the chest  Consultants:   Oncology, Dr. Julien Nordmann Radiation oncology Tammi Klippel  Antimicrobials:  Anti-infectives    None     Medications  Scheduled Meds: . albuterol  2.5 mg Nebulization TID  . amLODipine  10 mg Oral Daily  . aspirin  162 mg Oral Daily  . atorvastatin  40 mg Oral Daily  . citalopram  20 mg Oral Daily  . docusate sodium  100 mg Oral BID  . enoxaparin (LOVENOX) injection  40 mg Subcutaneous Q24H  . feeding supplement (GLUCERNA SHAKE)  237 mL Oral TID BM  .  fenofibrate  160 mg Oral Daily  . insulin aspart  0-9 Units Subcutaneous TID WC  . lisinopril  10 mg Oral Daily  . metoprolol succinate  12.5 mg Oral Daily  . spironolactone  25 mg Oral Daily  . tiotropium  18 mcg Inhalation Daily   Continuous Infusions: . dextrose 5 % and 0.9% NaCl     PRN Meds:.acetaminophen **OR** acetaminophen, diltiazem, hydrALAZINE, HYDROmorphone (DILAUDID) injection, ondansetron **OR** ondansetron (ZOFRAN) IV  Antibiotics   Anti-infectives    None      Subjective:   Naeema Finkbiner   Objective:   Vitals:   12/12/15 0400 12/12/15 0500 12/12/15 0600 12/12/15 0800  BP: (!) 127/45 (!) 158/74 (!) 146/49 (!) 152/49  Pulse: 77 86 82 89  Resp: 15 (!) '21 18 16  '$ Temp: 99.1 F (37.3 C)   98.4 F (36.9 C)  TempSrc: Oral   Oral  SpO2: 98% 97% 96% 99%  Weight:  58.4 kg (128 lb 12 oz)    Height:        Intake/Output Summary (Last 24 hours) at 12/12/15 0831 Last data filed at 12/12/15 0800  Gross per 24 hour  Intake          2081.67 ml  Output              200 ml  Net          1881.67 ml    Wt Readings from Last 3 Encounters:  12/12/15 58.4 kg (128 lb 12 oz)  12/01/15 55.6 kg (122 lb 9.6 oz)  11/23/15 58.5 kg (129 lb)   Exam  General: Alert and oriented x 3, Frail and in no distress  HEENT:  PERRLA, EOMI, Anicteric Sclera, mucous membranes moist.   Neck: Supple, no JVD, no masses   Cardiovascular: S1 S2 auscultated, no rubs, murmurs or gallops. Regular rate and rhythm.  Respiratory: bilateral expiratory wheezing  Gastrointestinal: Soft, nontender, nondistended, + bowel sounds  Ext: no cyanosis clubbing or edema  Neuro: AAOx3, Cr N's II- XII. Strength 5/5 upper and lower extremities bilaterally  Skin: No rashes  Psych: Normal affect and demeanor, alert and oriented x3   Data Reviewed:  I have personally reviewed following labs and imaging studies  Micro Results Recent Results (from the past 240 hour(s))  MRSA PCR Screening     Status:  None   Collection Time: 12/10/15  4:00 PM  Result Value Ref Range Status   MRSA by PCR NEGATIVE NEGATIVE Final    Comment:        The GeneXpert MRSA Assay (FDA approved for NASAL specimens only), is one component of a comprehensive MRSA colonization surveillance program. It is not intended to diagnose MRSA infection nor  to guide or monitor treatment for MRSA infections.     Radiology Reports Dg Chest 2 View  Result Date: 11/19/2015 CLINICAL DATA:  Preop for biopsy of right lung lesion EXAM: CHEST  2 VIEW COMPARISON:  Chest x-ray of 11/06/2015, PET-CT of 10/30/2015, and CT chest of 10/21/2005 FINDINGS: The right hilar and mediastinal mass noted on previous CT is unchanged by chest x-ray. No parenchymal infiltrate or pleural effusion is seen and no significant atelectasis is noted. Mediastinal and hilar contours are unchanged and the heart is within normal limits in size. No bony abnormality is seen. IMPRESSION: No change in right hilar -mediastinal mass. No parenchymal infiltrate or pleural effusion is seen. Electronically Signed   By: Ivar Drape M.D.   On: 11/19/2015 11:51   Mr Jeri Cos RW Contrast  Result Date: 12/09/2015 CLINICAL DATA:  Lung cancer staging. EXAM: MRI HEAD WITHOUT AND WITH CONTRAST TECHNIQUE: Multiplanar, multiecho pulse sequences of the brain and surrounding structures were obtained without and with intravenous contrast. CONTRAST:  59m MULTIHANCE GADOBENATE DIMEGLUMINE 529 MG/ML IV SOLN COMPARISON:  MRI 10/12/2013 FINDINGS: Mild cerebral atrophy unchanged from the prior study. Negative for hydrocephalus Negative for acute infarct. Scattered white matter hyperintensities similar to the prior study and consistent with chronic microvascular ischemia. Chronic ischemic changes in the pons have progressed in the interval. Negative for intracranial hemorrhage.  No fluid collection. Negative for metastatic disease. No mass lesion or edema. Postcontrast imaging demonstrates no  enhancing mass lesion. Incidental note is made of an enhancing small venous anomaly in the right parietal lobe. Normal enhancement of the dural venous sinuses. Paranasal sinuses clear. Normal orbital structures. Bilateral cataract extraction. Bilateral mastoid sinus effusion. IMPRESSION: Atrophy and chronic microvascular ischemic change.  No acute infarct Negative for metastatic disease to the brain Bilateral mastoid sinus effusion. Electronically Signed   By: CFranchot GalloM.D.   On: 12/09/2015 07:38   Lab Data:  CBC:  Recent Labs Lab 12/10/15 0348  WBC 9.9  NEUTROABS 5.7  HGB 10.8*  HCT 34.2*  MCV 92.4  PLT 3431  Basic Metabolic Panel:  Recent Labs Lab 12/10/15 0348  NA 136  K 3.3*  CL 103  CO2 25  GLUCOSE 209*  BUN 5*  CREATININE 0.69  CALCIUM 8.9   GFR: Estimated Creatinine Clearance: 56.5 mL/min (by C-G formula based on SCr of 0.8 mg/dL). Liver Function Tests:  Recent Labs Lab 12/10/15 0348  AST 18  ALT 13*  ALKPHOS 91  BILITOT 0.1*  PROT 6.1*  ALBUMIN 3.0*   No results for input(s): LIPASE, AMYLASE in the last 168 hours. No results for input(s): AMMONIA in the last 168 hours. Coagulation Profile: No results for input(s): INR, PROTIME in the last 168 hours. Cardiac Enzymes:  Recent Labs Lab 12/10/15 0348 12/10/15 0758 12/10/15 1442  TROPONINI <0.03 <0.03 <0.03   BNP (last 3 results) No results for input(s): PROBNP in the last 8760 hours. HbA1C: No results for input(s): HGBA1C in the last 72 hours. CBG:  Recent Labs Lab 12/11/15 0739 12/11/15 1225 12/11/15 1543 12/11/15 2129 12/12/15 0735  GLUCAP 144* 151* 209* 94 63*   Lipid Profile: No results for input(s): CHOL, HDL, LDLCALC, TRIG, CHOLHDL, LDLDIRECT in the last 72 hours. Thyroid Function Tests: No results for input(s): TSH, T4TOTAL, FREET4, T3FREE, THYROIDAB in the last 72 hours. Anemia Panel: No results for input(s): VITAMINB12, FOLATE, FERRITIN, TIBC, IRON, RETICCTPCT in the last  72 hours. Urine analysis: No results found for: COLORURINE, APPEARANCEUR, [place]  GLUCOSEU, HGBUR, BILIRUBINUR, KETONESUR, PROTEINUR, UROBILINOGEN, NITRITE, LEUKOCYTESUR  Critical Care Time Spent:  66 mins  Irwin Brakeman M.D. [facility] 12/12/2015, 8:31 AM  Pager: [phone] Between 7am to 7pm - call Pager - [phone]  After 7pm go to [email or web address] - password [email or web address]  Call night coverage person covering after 7pm

## 2015-12-12 NOTE — Progress Notes (Signed)
Pt received from ICU/Stepdown into room 1433. Report obtained from Amy, RN. Agree with shift assessment.

## 2015-12-13 ENCOUNTER — Other Ambulatory Visit: Payer: Self-pay

## 2015-12-13 ENCOUNTER — Inpatient Hospital Stay (HOSPITAL_COMMUNITY): Payer: PPO

## 2015-12-13 LAB — COMPREHENSIVE METABOLIC PANEL
ALBUMIN: 3 g/dL — AB (ref 3.5–5.0)
ALK PHOS: 98 U/L (ref 38–126)
ALT: 13 U/L — ABNORMAL LOW (ref 14–54)
AST: 20 U/L (ref 15–41)
Anion gap: 6 (ref 5–15)
BILIRUBIN TOTAL: 0.7 mg/dL (ref 0.3–1.2)
BUN: 8 mg/dL (ref 6–20)
CALCIUM: 8.5 mg/dL — AB (ref 8.9–10.3)
CO2: 26 mmol/L (ref 22–32)
Chloride: 104 mmol/L (ref 101–111)
Creatinine, Ser: 0.6 mg/dL (ref 0.44–1.00)
GFR calc Af Amer: >60 mL/min (ref >60)
GFR calc non Af Amer: >60 mL/min (ref >60)
GLUCOSE: 114 mg/dL — AB (ref 65–99)
Potassium: 3.8 mmol/L (ref 3.5–5.1)
Sodium: 136 mmol/L (ref 135–145)
TOTAL PROTEIN: 6.6 g/dL (ref 6.5–8.1)

## 2015-12-13 LAB — CBC
HEMATOCRIT: 32.5 % — AB (ref 36.0–46.0)
HEMOGLOBIN: 10.4 g/dL — AB (ref 12.0–15.0)
MCH: 29.5 pg (ref 26.0–34.0)
MCHC: 32 g/dL (ref 30.0–36.0)
MCV: 92.1 fL (ref 78.0–100.0)
Platelets: 467 10*3/uL — ABNORMAL HIGH (ref 150–400)
RBC: 3.53 MIL/uL — AB (ref 3.87–5.11)
RDW: 13.8 % (ref 11.5–15.5)
WBC: 14.8 10*3/uL — AB (ref 4.0–10.5)

## 2015-12-13 LAB — TROPONIN I
TROPONIN I: 0.05 ng/mL — AB (ref <0.03)
Troponin I: 0.08 ng/mL (ref <0.03)
Troponin I: 0.07 ng/mL (ref <0.03)
Troponin I: 0.05 ng/mL (ref <0.03)

## 2015-12-13 LAB — TSH: TSH: 1.063 u[IU]/mL (ref 0.350–4.500)

## 2015-12-13 LAB — GLUCOSE, CAPILLARY
GLUCOSE-CAPILLARY: 137 mg/dL — AB (ref 65–99)
GLUCOSE-CAPILLARY: 147 mg/dL — AB (ref 65–99)
Glucose-Capillary: 304 mg/dL — ABNORMAL HIGH (ref 65–99)

## 2015-12-13 LAB — BRAIN NATRIURETIC PEPTIDE: B NATRIURETIC PEPTIDE 5: 331.1 pg/mL — AB (ref 0.0–100.0)

## 2015-12-13 MED ORDER — DILTIAZEM HCL 30 MG PO TABS
30.0000 mg | ORAL_TABLET | Freq: Once | ORAL | Status: AC
  Administered 2015-12-13: 30 mg via ORAL
  Filled 2015-12-13: qty 1

## 2015-12-13 MED ORDER — DILTIAZEM HCL ER COATED BEADS 180 MG PO CP24
180.0000 mg | ORAL_CAPSULE | Freq: Every day | ORAL | Status: DC
  Administered 2015-12-14 – 2015-12-15 (×2): 180 mg via ORAL
  Filled 2015-12-13 (×2): qty 1

## 2015-12-13 MED ORDER — ALBUTEROL SULFATE (2.5 MG/3ML) 0.083% IN NEBU: 2.5000 mg | INHALATION_SOLUTION | RESPIRATORY_TRACT | Status: DC | PRN

## 2015-12-13 MED ORDER — IPRATROPIUM-ALBUTEROL 0.5-2.5 (3) MG/3ML IN SOLN
3.0000 mL | Freq: Four times a day (QID) | RESPIRATORY_TRACT | Status: DC
Start: 2015-12-14 — End: 2015-12-17
  Administered 2015-12-14 – 2015-12-17 (×16): 3 mL via RESPIRATORY_TRACT
  Filled 2015-12-13 (×16): qty 3

## 2015-12-13 MED ORDER — ALBUTEROL SULFATE (2.5 MG/3ML) 0.083% IN NEBU
2.5000 mg | INHALATION_SOLUTION | RESPIRATORY_TRACT | Status: DC | PRN
Start: 2015-12-13 — End: 2015-12-18
  Administered 2015-12-15: 2.5 mg via RESPIRATORY_TRACT
  Filled 2015-12-13: qty 3

## 2015-12-13 MED ORDER — MORPHINE SULFATE (PF) 2 MG/ML IV SOLN
2.0000 mg | INTRAVENOUS | Status: DC | PRN
  Administered 2015-12-13: 2 mg via INTRAVENOUS
  Filled 2015-12-13: qty 1

## 2015-12-13 MED ORDER — DILTIAZEM HCL 25 MG/5ML IV SOLN
10.0000 mg | Freq: Once | INTRAVENOUS | Status: AC
  Administered 2015-12-13: 10 mg via INTRAVENOUS
  Filled 2015-12-13: qty 5

## 2015-12-13 MED ORDER — FUROSEMIDE 10 MG/ML IJ SOLN
INTRAMUSCULAR | Status: AC
  Filled 2015-12-13: qty 4

## 2015-12-13 MED ORDER — IPRATROPIUM-ALBUTEROL 0.5-2.5 (3) MG/3ML IN SOLN
3.0000 mL | RESPIRATORY_TRACT | Status: DC
  Administered 2015-12-13 (×2): 3 mL via RESPIRATORY_TRACT
  Filled 2015-12-13 (×2): qty 3

## 2015-12-13 MED ORDER — MORPHINE SULFATE (PF) 4 MG/ML IV SOLN
4.0000 mg | INTRAVENOUS | Status: DC | PRN
  Administered 2015-12-13 – 2015-12-17 (×17): 4 mg via INTRAVENOUS
  Filled 2015-12-13 (×17): qty 1

## 2015-12-13 MED ORDER — MORPHINE SULFATE (PF) 2 MG/ML IV SOLN: 2.0000 mg | Freq: Once | INTRAVENOUS | Status: DC

## 2015-12-13 MED ORDER — HYDROMORPHONE HCL 1 MG/ML IJ SOLN
1.0000 mg | INTRAMUSCULAR | Status: DC | PRN
  Filled 2015-12-13: qty 1

## 2015-12-13 MED ORDER — FUROSEMIDE 10 MG/ML IJ SOLN
40.0000 mg | Freq: Once | INTRAMUSCULAR | Status: AC
  Administered 2015-12-13: 40 mg via INTRAVENOUS

## 2015-12-13 MED ORDER — METHYLPREDNISOLONE SODIUM SUCC 125 MG IJ SOLR
80.0000 mg | Freq: Once | INTRAMUSCULAR | Status: AC
  Administered 2015-12-13: 80 mg via INTRAVENOUS
  Filled 2015-12-13: qty 2

## 2015-12-13 NOTE — Progress Notes (Signed)
87 spoke with Georges Mouse MD informed him of new development stated in my last note and also that pt has converted back to A Fib RVR rate 114 at 0249.  and  moderate amount of  red/pink sputum pt has just coughed up . RT in and gave a new PRN tx,  Dr. Maudie Mercury orders stat EKG, CXR, and labs.   BP at 0300 160/72, HR 99, RR 24, sats remain upper 90%.   '10mg'$  of IV Cardizem given and po Cardizem at 0315.  Dr. Lorin Mercy in at [facility] and orders 40 IV lasix (given).  I clarified with Latricia at [facility] that pt had only had one episode of AFib this admission on 8/4 but has been SR since early AM of 8/5 and throughout the day and tonight.   Currently pt refusing foley and HR is 98 to 104 at 0410.  Will continue to monitor.

## 2015-12-13 NOTE — Progress Notes (Signed)
  Radiation Oncology         [phone] ________________________________  Name: Katherine Bullock MRN: [id number]  Date: 12/09/2015  DOB: Sep 25, 1945  Chart Note:  Patient condition without significant improvement, remains in a-fib with RVR and elevated troponin.  Patient comfortable.  Recommend continue radiation therapy on 8/7 as planned to induce tumor response given potentially reversible etiology for respiratory and cardiac issues.  Also recommend close contact with oncology service (rad-onc and med-onc) prior to consideration of transition to comfort care given the complexity of the case and anticipated tumor response over the next 48-72 hours.   ________________________________  Sheral Apley Tammi Klippel, M.D.

## 2015-12-13 NOTE — Progress Notes (Signed)
Pt having increase SOB, lung sound wet, IVF running at 60cc/hr,  Net I/O greater than 3 liters positive,  sats 97% on 3 l/m [place] after treatment,  RR 30,  HR 97,  J. Kime on call and notified and awaiting response

## 2015-12-13 NOTE — Progress Notes (Signed)
BNP returns at 331.1, Troponi is 0.08, CXR no new finding.  Pt resting comfortably without complaints HR remaining mostly in high 90's and remains AFib.    Dr. Lorin Mercy updated.

## 2015-12-13 NOTE — Progress Notes (Signed)
[facility]                                                                              Patient Demographics  Katherine Bullock, is a 70 y.o. female, DOB - 1945-06-22, XKG:[id number]  Admit date - 12/10/2015   Admitting Physician Toy Baker, MD  Outpatient Primary MD for the patient is [facility], MD  Outpatient specialists:   LOS - 3  days   No chief complaint on file.     Brief summary  Katherine Bullock is a 70 y.o. 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 EKG was showing T-wave changes in the lateral leads with patient's blood pressure more than 702 systolic. Labs including troponin were unremarkable. Patient was placed on nitroglycerin infusion for blood pressure control and chest pain.  CT angiography of the chest done shows - severe progression of mediastinal tumor since June of this year. Was showing 14 cm centrally necrotic tumor with epicenter in the posterior mediastinum to the right midline with severe mass effect on the central airways and right pulmonary artery. Tumor inseparable aorta and ventral thoracic spine. No spinal information suspected by CT.  Patient was transferred for further management. By the time patient arrived patient is off nitroglycerin drip and blood pressure has improved. Chest pain and upper back pain is controlled with pain relief medication. Not in distress.  Pt started radiation treatments.    Assessment & Plan   Acute respiratory failure with hypoxia with chest pain and upper back pain most likely secondary to severe progression of mediastinal tumor - - CT of the chest showed progression of the mediastinal tumor, severe mass effect on the central airways and right pulmonary artery. - Continue pain control, discussed with Dr. Julien Nordmann, patient's  primary oncologist, recommended transfer to [facility] long hospital. Dr. Julien Nordmann also discussed with Dr. Tammi Klippel with radiation oncology.  - Pt had first radiation treatments 8/4, 8/5 and resume 8/7.   - Pt complaining of increasing respiratory distress and uncontrolled pain 8/6.  CXR 8/5: no infiltrates or CHF seen. Pt wheezing diffusely.  Increase neb treatments today.  Continue oxygen support.     Back pain / Chest Pain - Pt says that pain is not controlled anymore.  Will try longer acting morphine for pain control.      Hypertensive urgency - resolved now. - suspect blood pressure was elevated secondary to severe uncontrolled pain. Now that pain is controlled, BP has been much better.  New Onset Atrial Fibrillation with RVR - likely precipitated by tumor pressing on heart, responded to IV diltiazem bolus.   - diltiazem daily ordered.   - echo completed  - TSH normal.  - I called and spoke with cardiology, recommended to not anticoagulate at this time given she is is currently undergoing aggressive radiation therapy and suspect subsequent tumor necrosis, monitor for recurrence of afib on tele, follow. She had recurrence of Afib with RVR last night treated with boluses of diltiazem.  Now rate is controlled, increasing oral diltiazem to 180 mg.  Elevated cardiac enzymes likely demand ischemia, no chest pain or ischemic EKG changes seen.   Diabetes mellitus type 2, controlled ([place]) - Pt not really eating now.  Will reduce glucose testing.     COPD - symptoms exacerbated, change to duonebs every 4 hours and albuterol neb every 2 hours prn. Give 1 dose of solumedrol 8/6.    Code Status: Full CODE STATUS DVT Prophylaxis:  SCD's Family Communication: family is torn about continuing aggressive treatments, daughter wants to focus on patient's comfort and dignity and son wants everything done.  I will ask for a goals of care consultation with our team.   Disposition Plan: TBD  Procedures:  CT  angiogram of the chest  Consultants:   Oncology, Dr. Julien Nordmann Radiation oncology Tammi Klippel  Antimicrobials:  Anti-infectives    None     Medications  Scheduled Meds: . amLODipine  5 mg Oral Daily  . aspirin  162 mg Oral Daily  . atorvastatin  40 mg Oral Daily  . citalopram  20 mg Oral Daily  . [START ON 12/14/2015] diltiazem  180 mg Oral Daily  . diltiazem  30 mg Oral Once  . docusate sodium  100 mg Oral BID  . enoxaparin (LOVENOX) injection  40 mg Subcutaneous Q24H  . feeding supplement (GLUCERNA SHAKE)  237 mL Oral TID BM  . fenofibrate  160 mg Oral Daily  . furosemide      . insulin aspart  0-9 Units Subcutaneous TID WC  . ipratropium-albuterol  3 mL Nebulization Q4H  . lisinopril  5 mg Oral Daily  . spironolactone  12.5 mg Oral Daily   Continuous Infusions:   PRN Meds:.acetaminophen **OR** acetaminophen, albuterol, diltiazem, hydrALAZINE, HYDROmorphone (DILAUDID) injection, ondansetron **OR** ondansetron (ZOFRAN) IV  Antibiotics   Anti-infectives    None     Subjective:   Katherine Bullock Pt says that she feels terrible, she is having SOB, wheezing, pain is uncontrolled, no appetite, coughing up blood-tinged sputum, too weak to ambulate or sit up in a chair.  Only drinking small sips and only eating bites of chocolate ice cream.    Objective:   Vitals:   12/13/15 0253 12/13/15 0300 12/13/15 0926 12/13/15 0946  BP: (!) 160/72 119/62 133/71   Pulse: 99 (!) 109 (!) 102   Resp: (!) 24 (!) 24 (!) 24   Temp:  99.6 F (37.6 C)    TempSrc:  Oral    SpO2: 99% 94%  93%  Weight:  58.8 kg (129 lb 11.2 oz)    Height:        Intake/Output Summary (Last 24 hours) at 12/13/15 1247 Last data filed at 12/13/15 0914  Gross per 24 hour  Intake             1630 ml  Output             3475 ml  Net            -1845 ml    Wt Readings from Last 3 Encounters:  12/13/15 58.8 kg (129 lb 11.2 oz)  12/01/15 55.6 kg (122 lb 9.6 oz)  11/23/15 58.5 kg (129 lb)   Exam  General: Alert  and oriented x 3, Frail and appears ill and uncomfortable.   HEENT:  PERRLA, EOMI, Anicteric Sclera, mucous membranes moist.   Neck: Supple, no JVD, no masses   Cardiovascular: S1 S2 auscultated, no rubs, murmurs or gallops. Regular rate and  rhythm.  Respiratory: diffuse increased bilateral expiratory wheezing  Gastrointestinal: Soft, nontender, nondistended, + bowel sounds  Ext: no cyanosis clubbing or edema  Neuro: AAOx3, Cr N's II- XII. Strength 5/5 upper and lower extremities bilaterally  Skin: No rashes  Psych: Normal affect and demeanor, alert and oriented x3   Data Reviewed:  I have personally reviewed following labs and imaging studies  Micro Results Recent Results (from the past 240 hour(s))  MRSA PCR Screening     Status: None   Collection Time: 12/10/15  4:00 PM  Result Value Ref Range Status   MRSA by PCR NEGATIVE NEGATIVE Final    Comment:        The GeneXpert MRSA Assay (FDA approved for NASAL specimens only), is one component of a comprehensive MRSA colonization surveillance program. It is not intended to diagnose MRSA infection nor to guide or monitor treatment for MRSA infections.     Radiology Reports Dg Chest 2 View  Result Date: 11/19/2015 CLINICAL DATA:  Preop for biopsy of right lung lesion EXAM: CHEST  2 VIEW COMPARISON:  Chest x-ray of 11/06/2015, PET-CT of 10/30/2015, and CT chest of 10/21/2005 FINDINGS: The right hilar and mediastinal mass noted on previous CT is unchanged by chest x-ray. No parenchymal infiltrate or pleural effusion is seen and no significant atelectasis is noted. Mediastinal and hilar contours are unchanged and the heart is within normal limits in size. No bony abnormality is seen. IMPRESSION: No change in right hilar -mediastinal mass. No parenchymal infiltrate or pleural effusion is seen. Electronically Signed   By: Ivar Drape M.D.   On: 11/19/2015 11:51   Mr Jeri Cos UR Contrast  Result Date: 12/09/2015 CLINICAL DATA:   Lung cancer staging. EXAM: MRI HEAD WITHOUT AND WITH CONTRAST TECHNIQUE: Multiplanar, multiecho pulse sequences of the brain and surrounding structures were obtained without and with intravenous contrast. CONTRAST:  45m MULTIHANCE GADOBENATE DIMEGLUMINE 529 MG/ML IV SOLN COMPARISON:  MRI 10/12/2013 FINDINGS: Mild cerebral atrophy unchanged from the prior study. Negative for hydrocephalus Negative for acute infarct. Scattered white matter hyperintensities similar to the prior study and consistent with chronic microvascular ischemia. Chronic ischemic changes in the pons have progressed in the interval. Negative for intracranial hemorrhage.  No fluid collection. Negative for metastatic disease. No mass lesion or edema. Postcontrast imaging demonstrates no enhancing mass lesion. Incidental note is made of an enhancing small venous anomaly in the right parietal lobe. Normal enhancement of the dural venous sinuses. Paranasal sinuses clear. Normal orbital structures. Bilateral cataract extraction. Bilateral mastoid sinus effusion. IMPRESSION: Atrophy and chronic microvascular ischemic change.  No acute infarct Negative for metastatic disease to the brain Bilateral mastoid sinus effusion. Electronically Signed   By: CFranchot GalloM.D.   On: 12/09/2015 07:38   Dg Chest Port 1 View  Result Date: 12/13/2015 CLINICAL DATA:  70year old female with shortness of breath, COPD. EXAM: PORTABLE CHEST 1 VIEW COMPARISON:  Chest CT dated 12/09/2015 FINDINGS: There is emphysematous changes of the lungs. Bibasilar atelectatic changes/ scarring. There is no pleural effusion or pneumothorax. Large opacity abutting the right thoracic spine compatible with known posterior mediastinal mass. Top-normal cardiac size. No acute osseous pathology. IMPRESSION: No acute cardiopulmonary process. Large right paraspinal opacity compatible with known posterior mediastinal mass. Electronically Signed   By: AAnner CreteM.D.   On: 12/13/2015 03:47    Lab Data:  CBC:  Recent Labs Lab 12/10/15 0348 12/12/15 1346 12/13/15 0328  WBC 9.9 14.1* 14.8*  NEUTROABS 5.7  --   --  HGB 10.8* 10.2* 10.4*  HCT 34.2* 31.2* 32.5*  MCV 92.4 92.9 92.1  PLT 390 490* 664*   Basic Metabolic Panel:  Recent Labs Lab 12/10/15 0348 12/12/15 1346 12/13/15 0328  NA 136 137 136  K 3.3* 3.9 3.8  CL 103 106 104  CO2 '25 24 26  '$ GLUCOSE 209* 85 114*  BUN 5* 12 8  CREATININE 0.69 0.63 0.60  CALCIUM 8.9 8.8* 8.5*   GFR: Estimated Creatinine Clearance: 56.5 mL/min (by C-G formula based on SCr of 0.8 mg/dL). Liver Function Tests:  Recent Labs Lab 12/10/15 0348 12/13/15 0328  AST 18 20  ALT 13* 13*  ALKPHOS 91 98  BILITOT 0.1* 0.7  PROT 6.1* 6.6  ALBUMIN 3.0* 3.0*   No results for input(s): LIPASE, AMYLASE in the last 168 hours. No results for input(s): AMMONIA in the last 168 hours. Coagulation Profile: No results for input(s): INR, PROTIME in the last 168 hours. Cardiac Enzymes:  Recent Labs Lab 12/10/15 0348 12/10/15 0758 12/10/15 1442 12/13/15 0328 12/13/15 0913  TROPONINI <0.03 <0.03 <0.03 0.08* 0.07*   BNP (last 3 results) No results for input(s): PROBNP in the last 8760 hours. HbA1C: No results for input(s): HGBA1C in the last 72 hours. CBG:  Recent Labs Lab 12/12/15 1639 12/12/15 2130 12/12/15 2304 12/13/15 0756 12/13/15 1155  GLUCAP 218* 64* 171* 137* 147*   Lipid Profile: No results for input(s): CHOL, HDL, LDLCALC, TRIG, CHOLHDL, LDLDIRECT in the last 72 hours. Thyroid Function Tests:  Recent Labs  12/13/15 0328  TSH 1.063   Anemia Panel: No results for input(s): VITAMINB12, FOLATE, FERRITIN, TIBC, IRON, RETICCTPCT in the last 72 hours. Urine analysis: No results found for: COLORURINE, APPEARANCEUR, LABSPEC, PHURINE, GLUCOSEU, HGBUR, BILIRUBINUR, KETONESUR, PROTEINUR, UROBILINOGEN, NITRITE, LEUKOCYTESUR  Critical Care Time Spent:  79 mins  Clanford Johnson M.D. [facility] 12/13/2015,  12:47 PM  Pager: [phone] Between 7am to 7pm - call Pager - [phone]  After 7pm go to [email or web address] - password [email or web address]  Call night coverage person covering after 7pm

## 2015-12-13 NOTE — Progress Notes (Addendum)
Called by Dr. Maudie Mercury regarding patient in afib with RVR.  He gave verbal orders from [facility] for labs, CXR, Cardizem 10 mg IV, 30 mg PO.  Upon my arrival, patient's HR was in 100 range s/p Cardizem (down from 120s) but she remained in respiratory distress.  She had recently received a neb without improvement.  Wheezing appreciated from bedside.  Bibasilar crackles midway up lung fields.  Patient given 40 mg IV Lasix and IVF discontinued.  Patient reported improvement in work of breathing following Lasix.  Will f/u labs/CXR.  She may need an additional dose of IV Cardizem and her nurse will call me if she does.  I have also asked the nurse to place a foley if needed.  I did discuss her overall clinical condition with the patient as well as her understanding of her disease.  She reports that they are still awaiting tissue diagnosis but she has completed 2 emergency radiation treatments.  She understands that the afib is likely from the malignancy and that her overall prognosis is poor.  She may need to transition to comfort care at some point in the near future if she is not responding to treatments.  She is DNR/DNI.  Carlyon Shadow, M.D.  Addendum: Labs, CXR, EKG unremarkable other than afib on EKG, elevated BNP with normal kidney function and no prior for comparison, and troponin slightly elevated.  Suspect cardiac strain from afib, mild volume overload, but primarily huge tumor burden that is causing symptoms.  Will continue to follow but suggest consideration of transition to comfort care soon to avoid complications that may make this situation markedly worse.  Per RN, HR sustaining mostly in high 90s and patient resting comfortably with RR 20.

## 2015-12-13 NOTE — Care Management Important Message (Signed)
Important Message  Patient Details  Name: Katherine Bullock MRN: [id number] Date of Birth: Apr 07, 1946   Medicare Important Message Given:  Yes    Apolonio Schneiders, RN 12/13/2015, 12:36 PM

## 2015-12-14 ENCOUNTER — Ambulatory Visit: Admission: RE | Admit: 2015-12-14 | Payer: PPO | Source: Ambulatory Visit | Admitting: Radiation Oncology

## 2015-12-14 ENCOUNTER — Ambulatory Visit: Payer: PPO

## 2015-12-14 ENCOUNTER — Ambulatory Visit
Admit: 2015-12-14 | Discharge: 2015-12-14 | Disposition: A | Discharge status: Home / Self Care | Payer: PPO | Attending: Radiation Oncology | Admitting: Radiation Oncology

## 2015-12-14 DIAGNOSIS — C3411 Malignant neoplasm of upper lobe, right bronchus or lung: Secondary | ICD-10-CM

## 2015-12-14 DIAGNOSIS — R0600 Dyspnea, unspecified: Secondary | ICD-10-CM | POA: Diagnosis present

## 2015-12-14 LAB — GLUCOSE, CAPILLARY
GLUCOSE-CAPILLARY: 182 mg/dL — AB (ref 65–99)
GLUCOSE-CAPILLARY: 188 mg/dL — AB (ref 65–99)
GLUCOSE-CAPILLARY: 260 mg/dL — AB (ref 65–99)
Glucose-Capillary: 183 mg/dL — ABNORMAL HIGH (ref 65–99)
Glucose-Capillary: 262 mg/dL — ABNORMAL HIGH (ref 65–99)

## 2015-12-14 LAB — TROPONIN I: TROPONIN I: 0.04 ng/mL — AB (ref <0.03)

## 2015-12-14 NOTE — Progress Notes (Signed)
Chaplain spoke with Pt post-radiation treatment.  She is surrounded by family.  Expresses desire to complete HCPOA paperwork in morning.  Chaplain will follow up.     [place], [place]

## 2015-12-14 NOTE — Progress Notes (Signed)
  Radiation Oncology         [phone] ________________________________  Name: Katherine Bullock MRN: [id number]  Date: 12/11/2015  DOB: 09/09/1945  SIMULATION AND TREATMENT PLANNING NOTE - inpatient    ICD-9-CM ICD-10-CM   1. Primary cancer of right upper lobe of lung (HCC) 162.3 C34.11     DIAGNOSIS: Likely stageIIIA(T3, N2, M0) right lung cancer pending final tissue diagnosis  NARRATIVE:  The patient was brought to the [facility].  Identity was confirmed.  All relevant records and images related to the planned course of therapy were reviewed.  The patient freely provided informed written consent to proceed with treatment after reviewing the details related to the planned course of therapy. The consent form was witnessed and verified by the simulation staff.  Then, the patient was set-up in a stable reproducible  supine position for radiation therapy.  CT images were obtained.  Surface markings were placed.  The CT images were loaded into the planning software.  Then the target and avoidance structures were contoured.  Treatment planning then occurred.  The radiation prescription was entered and confirmed.  Then, I designed and supervised the construction of a total of 4 medically necessary complex treatment devices.  I have requested : 3D Simulation  I have requested a DVH of the following structures: Heart, lungs, spinal cord.  I have ordered:dose calc.  PLAN:  The patient will receive 8 Gy in 2 fractions followed by a boost at a lower dose per fraction for approximately 15 planned treatments.  -----------------------------------  Blair Promise, PhD, MD

## 2015-12-14 NOTE — Consult Note (Signed)
Consultation Note Date: 12/14/2015   Patient Name: Katherine Bullock  DOB: 10-01-1945  MRN: [id number]  Age / Sex: 70 y.o., female  PCP: Marco Collie, MD Referring Physician: Murlean Iba, MD  Reason for Consultation: Establishing goals of care  HPI/Patient Profile: 70 y.o. female  admitted on 12/10/2015   Clinical Assessment and Goals of Care:  Life limiting illness: Recent diagnosis of lung mass  70 year old lady with recently diagnosed lung cancer. Patient has underlying past medical history significant for chronic obstructive pulmonary disease, anxiety, depression, diabetes mellitus, dyslipidemia, hypertension, stroke, osteoarthritis. Long history of smoking. Patient has been experiencing shortness of breath and cough for the past several months. She was diagnosed with a prominent mass in her right lung and was seen and evaluated by Dr. Rogue Jury recently. She was referred to radiation oncology. She was started on radiation treatments recently and has received 2 treatments a-4 and 12-12-15. Patient has been admitted to the hospital with shortness of breath, uncontrolled pain, ongoing hemoptysis and essentially functional decline. Imaging shows progression of mass. Reportedly biopsy results to pending  Palliative care consultation for symptom management and goals of care.  Patient is an elderly lady resting in bed. Her brother Sonia Side who is visiting from [place] is present at the bedside her daughter [name] who is her primary caregiver is also present at the bedside. Chart reviewed extensively. Reviewed with Dr. Johny Shears notes from radiation oncology.  I introduced myself and palliative care as follows: Palliative medicine is specialized medical care for people living with serious illness. It focuses on providing relief from the symptoms and stress of a serious illness. The goal is to improve quality of life for both  the patient and the family.  Patient states that she did not rest well overnight. She complains of pain in her chest as well as abdomen. She is at present not short of breath but has been. She does not have any nausea. Her appetite is basically nonexistent at this point. Her last bowel movement was 3 days ago. She is concerned that there is increase in size of her mass based on recent imaging.  Palliative discussions undertaken. Discussed about concentrating first and foremost on aggressive symptom management. Discussed with patient and family about medical management of symptoms, uncontrolled pain, shortness of breath, constipation. Discussed about discontinuing Colace, adding senna.  Agree with continuation with radiation treatments. Palliative will continue to follow along for symptom management, gently initiate goals of care discussions. Advanced directives discussions undertaken. Patient is awake alert oriented. She elects her daughter [name] present at the bedside, her primary caregiver to be her healthcare power of attorney. She agrees to chaplain consult for completion of advance directives.  NEXT OF KIN  Daughter Tammy.  SUMMARY OF RECOMMENDATIONS    Aggressive symptom management Chaplain consult for completion of advance directives Continue radiation treatments Appreciate oncology, radiation oncology input Palliative will continue to follow along. Thank you for the consult.  Code Status/Advance Care Planning:  Full code for now, ongoing discussions.  Symptom Management:    Scheduled Morphine  Continue PRNs  Expand bowel regimen.   Palliative Prophylaxis:   Bowel Regimen   Psycho-social/Spiritual:   Desire for further Chaplaincy support:yes: Chaplain consult for completion of advance directives.  Additional Recommendations: Caregiving  Support/Resources  Prognosis:   Unable to determine  Discharge Planning: To Be Determined      Primary Diagnoses: Present  on Admission: . Acute respiratory failure with hypoxia ([place]) . Back pain . Hypertensive urgency . Acute respiratory failure ([place])   I have reviewed the medical record, interviewed the patient and family, and examined the patient. The following aspects are pertinent.  Past Medical History:  Diagnosis Date  . Anxiety   . Arthritis   . Cataract   . Cholelithiases   . COPD (chronic obstructive pulmonary disease) ([place])   . Depression   . Diabetes mellitus   . Hyperlipidemia   . Hypertension   . Stroke [facility])    Social History   Social History  . Marital status: Widowed    Spouse name: N/A  . Number of children: N/A  . Years of education: N/A   Occupational History  . retired    Social History Main Topics  . Smoking status: Current Every Day Smoker    Packs/day: 1.00    Years: 50.00    Types: Cigarettes    Start date: 05/09/1965  . Smokeless tobacco: Current User    Types: Snuff     Comment: Peak rate of 1ppd  . Alcohol use No  . Drug use: No  . Sexual activity: Not Currently   Other Topics Concern  . None   Social History Narrative   Originally from [place]. Previously worked Psychologist, sport and exercise. She recently just got a dog. No bird exposure. No mold exposure.     Family History  Problem Relation Age of Onset  . Diabetes Mother   . Heart disease Mother   . Breast cancer Mother   . Hyperlipidemia Mother   . Hypertension Mother   . Heart attack Mother   . Heart disease Father   . Heart attack Father   . Hypertension Son   . Breast cancer Maternal Aunt   . Lung disease Neg Hx    Scheduled Meds: . amLODipine  5 mg Oral Daily  . aspirin  162 mg Oral Daily  . atorvastatin  40 mg Oral Daily  . citalopram  20 mg Oral Daily  . diltiazem  180 mg Oral Daily  . docusate sodium  100 mg Oral BID  . enoxaparin (LOVENOX) injection  40 mg Subcutaneous Q24H  . feeding supplement (GLUCERNA SHAKE)  237 mL Oral TID BM  . insulin aspart  0-9 Units Subcutaneous TID WC  .  ipratropium-albuterol  3 mL Nebulization QID  . lisinopril  5 mg Oral Daily  .  morphine injection  2 mg Intravenous Once  . spironolactone  12.5 mg Oral Daily   Continuous Infusions:  PRN Meds:.acetaminophen **OR** acetaminophen, albuterol, diltiazem, hydrALAZINE, morphine injection, ondansetron **OR** ondansetron (ZOFRAN) IV Medications Prior to Admission:  Prior to Admission medications   Medication Sig Start Date End Date Taking? Authorizing Provider  amLODipine (NORVASC) 10 MG tablet Take 10 mg by mouth daily.   Yes Historical Provider, MD  aspirin 81 MG tablet Take 162 mg by mouth daily.    Yes Historical Provider, MD  citalopram (CELEXA) 40 MG tablet Take 40 mg by mouth daily.   Yes Historical Provider, MD  cyclobenzaprine (FLEXERIL) 10 MG  tablet Take 10 mg by mouth 3 (three) times daily as needed for muscle spasms.   Yes Historical Provider, MD  gemfibrozil (LOPID) 600 MG tablet Take 600 mg by mouth 2 (two) times daily before a meal.   Yes Historical Provider, MD  glimepiride (AMARYL) 1 MG tablet Take 1 mg by mouth daily with breakfast.    Yes Historical Provider, MD  hydrALAZINE (APRESOLINE) 50 MG tablet Take 50 mg by mouth 3 (three) times daily.   Yes Historical Provider, MD  Insulin Glargine (LANTUS SOLOSTAR) 100 UNIT/ML Solostar Pen Inject 60 Units into the skin 2 (two) times daily.   Yes Historical Provider, MD  ketorolac (TORADOL) 10 MG tablet Take 10 mg by mouth every 4 (four) hours as needed for moderate pain.   Yes Historical Provider, MD  metFORMIN (GLUCOPHAGE) 1000 MG tablet Take 1,000 mg by mouth 2 (two) times daily.   Yes Historical Provider, MD  Multiple Vitamins-Minerals (ZINC PO) Take 1 tablet by mouth daily.   Yes Historical Provider, MD  naproxen sodium (ANAPROX) 220 MG tablet Take 220 mg by mouth daily as needed (pain).   Yes Historical Provider, MD  oxyCODONE-acetaminophen (PERCOCET) 5-325 MG tablet Take 1 tablet by mouth every 6 (six) hours as needed for severe  pain. 12/03/15  Yes Javier Glazier, MD  spironolactone (ALDACTONE) 50 MG tablet Take 50 mg by mouth daily.   Yes Historical Provider, MD  Tiotropium Bromide Monohydrate (SPIRIVA RESPIMAT) 2.5 MCG/ACT AERS Inhale 2 puffs into the lungs every morning. 12/01/15  Yes Javier Glazier, MD   Allergies  Allergen Reactions  . Amitriptyline Swelling    Throat swelling patient thinks  . Flexeril [Cyclobenzaprine Hcl] Itching    Patient is a poor historian. This has been prescribed and filled in 2017.   Review of Systems + for pain generalized.   Physical Exam Age-appropriate appearing elderly lady resting in bed Frail, chronically ill-appearing S1-S2 Scattered wheezes anterior lung fields Muscle wasting Awake alert is able to provide her own history Neck is supple  Vital Signs: BP (!) 135/55   Pulse 78   Temp 98.7 F (37.1 C) (Oral)   Resp 18   Ht '5\' 4"'$  (1.626 m)   Wt 58.5 kg (129 lb)   SpO2 93%   BMI 22.14 kg/m  Pain Assessment: 0-10 POSS *See Group Information*: 1-Acceptable,Awake and alert Pain Score: 3    SpO2: SpO2: 93 % O2 Device:SpO2: 93 % O2 Flow Rate: .O2 Flow Rate (L/min): 3 L/min  IO: Intake/output summary:   Intake/Output Summary (Last 24 hours) at 12/14/15 1118 Last data filed at 12/14/15 0500  Gross per 24 hour  Intake              230 ml  Output             1350 ml  Net            -1120 ml    LBM: Last BM Date: 12/11/15 Baseline Weight: Weight: 56.7 kg (125 lb) Most recent weight: Weight: 58.5 kg (129 lb)     Palliative Assessment/Data:   Flowsheet Rows   Flowsheet Row Most Recent Value  Intake Tab  Referral Department  Hospitalist  Unit at Time of Referral  Oncology Unit  Palliative Care Primary Diagnosis  Cancer  Date Notified  12/13/15  Palliative Care Type  New Palliative care  Reason for referral  Clarify Goals of Care  Date of Admission  12/10/15  Date first seen by Palliative  Care  12/14/15  # of days Palliative referral response time   1 Day(s)  # of days IP prior to Palliative referral  3  Clinical Assessment  Palliative Performance Scale Score  30%  Pain Max last 24 hours  7  Pain Min Last 24 hours  6  Dyspnea Max Last 24 Hours  6  Dyspnea Min Last 24 hours  5  Psychosocial & Spiritual Assessment  Palliative Care Outcomes  Patient/Family meeting held?  Yes  Who was at the meeting?  patient, brother, daughter.   Palliative Care Outcomes  Clarified goals of care, Provided advance care planning  Palliative Care follow-up planned  Yes, Home      Time In: 8 am Time Out: 9 am  Time Total: 60 min  Greater than 50%  of this time was spent counseling and coordinating care related to the above assessment and plan.  Signed by: Loistine Chance, MD  [phone] Please contact Palliative Medicine Team phone at [phone] for questions and concerns.  For individual provider: See Shea Evans

## 2015-12-14 NOTE — Progress Notes (Signed)
Acknowledge consult for Advance Directive.   Pt off floor for radiation treatment at time of chaplain arrival.  Will follow up to complete document.   [place], [place]

## 2015-12-14 NOTE — Progress Notes (Addendum)
[facility]                                                                              Patient Demographics  Katherine Bullock, is a 70 y.o. female, DOB - 1946/02/27, JGO:[id number]  Admit date - 12/10/2015   Admitting Physician Toy Baker, MD  Outpatient Primary MD for the patient is [facility], MD  Outpatient specialists:   LOS - 3  days   No chief complaint on file.     Brief summary  Katherine Bullock is a 70 y.o. 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 EKG was showing T-wave changes in the lateral leads with patient's blood pressure more than 559 systolic. Labs including troponin were unremarkable. Patient was placed on nitroglycerin infusion for blood pressure control and chest pain.  CT angiography of the chest done shows - severe progression of mediastinal tumor since June of this year. Was showing 14 cm centrally necrotic tumor with epicenter in the posterior mediastinum to the right midline with severe mass effect on the central airways and right pulmonary artery. Tumor inseparable aorta and ventral thoracic spine. No spinal information suspected by CT.  Patient was transferred for further management. By the time patient arrived patient is off nitroglycerin drip and blood pressure has improved. Chest pain and upper back pain is controlled with pain relief medication. Not in distress.  Pt started radiation treatments.    Assessment & Plan   Acute respiratory failure with hypoxia with chest pain and upper back pain most likely secondary to severe progression of mediastinal tumor - - CT of the chest showed progression of the mediastinal tumor, severe mass effect on the central airways and right pulmonary artery. - Continue pain control, discussed with Dr. Julien Nordmann, patient's  primary oncologist, recommended transfer to [facility] long hospital. Dr. Julien Nordmann also discussed with Dr. Tammi Klippel with radiation oncology.  - Pt had first radiation treatments 8/4, 8/5 and resume 8/7.   - Pt complaining of increasing respiratory distress and uncontrolled pain 8/6.  CXR 8/5: no infiltrates or CHF seen. Continue oxygen support.     Back pain / Chest Pain - Pt says that pain is not controlled anymore.  Will try longer acting morphine for pain control.      Hypertensive urgency - resolved now. - suspect blood pressure was elevated secondary to severe uncontrolled pain. Now that pain is controlled, BP has been much better.  New Onset Atrial Fibrillation with RVR - likely precipitated by tumor pressing on heart, responded to IV diltiazem bolus.   - diltiazem daily ordered.   - echo completed  - TSH normal.  - I called and spoke with cardiology, recommended to not anticoagulate at this time given she is is currently undergoing aggressive radiation therapy and suspect subsequent tumor necrosis, monitor for recurrence of afib on tele, follow. She had recurrence of Afib with RVR last night treated with boluses of diltiazem.  Now rate is controlled, increasing oral diltiazem to  180 mg.  Elevated cardiac enzymes likely demand ischemia, no chest pain or ischemic EKG changes seen.   Diabetes mellitus type 2, controlled ([place]) - Pt not really eating now.  Will reduce glucose testing.     COPD - symptoms exacerbated, change to duonebs every 4 hours and albuterol neb every 2 hours prn. Give 1 dose of solumedrol 8/6.    Severe protein calorie malnutrition  Code Status: Full CODE STATUS DVT Prophylaxis:  SCD's Family Communication: family is torn about continuing aggressive treatments, daughter wants to focus on patient's comfort and dignity and son wants everything done.  I will ask for a goals of care consultation with our team.   Disposition Plan: TBD  Procedures:  CT angiogram of the  chest  Consultants:   Oncology, Dr. Julien Nordmann Radiation oncology Tammi Klippel  Antimicrobials:  Anti-infectives    None     Medications  Scheduled Meds: . amLODipine  5 mg Oral Daily  . aspirin  162 mg Oral Daily  . atorvastatin  40 mg Oral Daily  . citalopram  20 mg Oral Daily  . diltiazem  180 mg Oral Daily  . docusate sodium  100 mg Oral BID  . enoxaparin (LOVENOX) injection  40 mg Subcutaneous Q24H  . feeding supplement (GLUCERNA SHAKE)  237 mL Oral TID BM  . insulin aspart  0-9 Units Subcutaneous TID WC  . ipratropium-albuterol  3 mL Nebulization QID  . lisinopril  5 mg Oral Daily  .  morphine injection  2 mg Intravenous Once  . spironolactone  12.5 mg Oral Daily   Continuous Infusions:   PRN Meds:.acetaminophen **OR** acetaminophen, albuterol, diltiazem, hydrALAZINE, morphine injection, ondansetron **OR** ondansetron (ZOFRAN) IV  Antibiotics   Anti-infectives    None     Subjective:   Katherine Bullock Pt says that she feels better today, pain better controlled.   Objective:   Vitals:   12/14/15 0458 12/14/15 0500 12/14/15 0935 12/14/15 0955  BP: 97/84   (!) 135/55  Pulse: 78     Resp: 18     Temp: 98.7 F (37.1 C)     TempSrc: Oral     SpO2: 97%  93%   Weight:  58.5 kg (129 lb)    Height:        Intake/Output Summary (Last 24 hours) at 12/14/15 1321 Last data filed at 12/14/15 1100  Gross per 24 hour  Intake              590 ml  Output             1350 ml  Net             -760 ml    Wt Readings from Last 3 Encounters:  12/14/15 58.5 kg (129 lb)  12/01/15 55.6 kg (122 lb 9.6 oz)  11/23/15 58.5 kg (129 lb)   Exam  General: Alert and oriented x 3, Frail and appears ill and uncomfortable.   HEENT:  PERRLA, EOMI, Anicteric Sclera, mucous membranes moist.   Neck: Supple, no JVD, no masses   Cardiovascular: S1 S2 auscultated, no rubs, murmurs or gallops. Regular rate and rhythm.  Respiratory: diffuse increased bilateral expiratory  wheezing  Gastrointestinal: Soft, nontender, nondistended, + bowel sounds  Ext: no cyanosis clubbing or edema  Neuro: AAOx3, Cr N's II- XII. Strength 5/5 upper and lower extremities bilaterally  Skin: No rashes  Psych: Normal affect and demeanor, alert and oriented x3   Data Reviewed:  I have personally reviewed following  labs and imaging studies  Micro Results Recent Results (from the past 240 hour(s))  MRSA PCR Screening     Status: None   Collection Time: 12/10/15  4:00 PM  Result Value Ref Range Status   MRSA by PCR NEGATIVE NEGATIVE Final    Comment:        The GeneXpert MRSA Assay (FDA approved for NASAL specimens only), is one component of a comprehensive MRSA colonization surveillance program. It is not intended to diagnose MRSA infection nor to guide or monitor treatment for MRSA infections.     Radiology Reports Dg Chest 2 View  Result Date: 11/19/2015 CLINICAL DATA:  Preop for biopsy of right lung lesion EXAM: CHEST  2 VIEW COMPARISON:  Chest x-ray of 11/06/2015, PET-CT of 10/30/2015, and CT chest of 10/21/2005 FINDINGS: The right hilar and mediastinal mass noted on previous CT is unchanged by chest x-ray. No parenchymal infiltrate or pleural effusion is seen and no significant atelectasis is noted. Mediastinal and hilar contours are unchanged and the heart is within normal limits in size. No bony abnormality is seen. IMPRESSION: No change in right hilar -mediastinal mass. No parenchymal infiltrate or pleural effusion is seen. Electronically Signed   By: Ivar Drape M.D.   On: 11/19/2015 11:51   Mr Jeri Cos SA Contrast  Result Date: 12/09/2015 CLINICAL DATA:  Lung cancer staging. EXAM: MRI HEAD WITHOUT AND WITH CONTRAST TECHNIQUE: Multiplanar, multiecho pulse sequences of the brain and surrounding structures were obtained without and with intravenous contrast. CONTRAST:  11m MULTIHANCE GADOBENATE DIMEGLUMINE 529 MG/ML IV SOLN COMPARISON:  MRI 10/12/2013 FINDINGS:  Mild cerebral atrophy unchanged from the prior study. Negative for hydrocephalus Negative for acute infarct. Scattered white matter hyperintensities similar to the prior study and consistent with chronic microvascular ischemia. Chronic ischemic changes in the pons have progressed in the interval. Negative for intracranial hemorrhage.  No fluid collection. Negative for metastatic disease. No mass lesion or edema. Postcontrast imaging demonstrates no enhancing mass lesion. Incidental note is made of an enhancing small venous anomaly in the right parietal lobe. Normal enhancement of the dural venous sinuses. Paranasal sinuses clear. Normal orbital structures. Bilateral cataract extraction. Bilateral mastoid sinus effusion. IMPRESSION: Atrophy and chronic microvascular ischemic change.  No acute infarct Negative for metastatic disease to the brain Bilateral mastoid sinus effusion. Electronically Signed   By: CFranchot GalloM.D.   On: 12/09/2015 07:38   Dg Chest Port 1 View  Result Date: 12/13/2015 CLINICAL DATA:  70year old female with shortness of breath, COPD. EXAM: PORTABLE CHEST 1 VIEW COMPARISON:  Chest CT dated 12/09/2015 FINDINGS: There is emphysematous changes of the lungs. Bibasilar atelectatic changes/ scarring. There is no pleural effusion or pneumothorax. Large opacity abutting the right thoracic spine compatible with known posterior mediastinal mass. Top-normal cardiac size. No acute osseous pathology. IMPRESSION: No acute cardiopulmonary process. Large right paraspinal opacity compatible with known posterior mediastinal mass. Electronically Signed   By: AAnner CreteM.D.   On: 12/13/2015 03:47   Lab Data:  CBC:  Recent Labs Lab 12/10/15 0348 12/12/15 1346 12/13/15 0328  WBC 9.9 14.1* 14.8*  NEUTROABS 5.7  --   --   HGB 10.8* 10.2* 10.4*  HCT 34.2* 31.2* 32.5*  MCV 92.4 92.9 92.1  PLT 390 490* 4630   Basic Metabolic Panel:  Recent Labs Lab 12/10/15 0348 12/12/15 1346  12/13/15 0328  NA 136 137 136  K 3.3* 3.9 3.8  CL 103 106 104  CO2 '25 24 26  '$ GLUCOSE 209*  85 114*  BUN 5* 12 8  CREATININE 0.69 0.63 0.60  CALCIUM 8.9 8.8* 8.5*   GFR: Estimated Creatinine Clearance: 56.5 mL/min (by C-G formula based on SCr of 0.8 mg/dL). Liver Function Tests:  Recent Labs Lab 12/10/15 0348 12/13/15 0328  AST 18 20  ALT 13* 13*  ALKPHOS 91 98  BILITOT 0.1* 0.7  PROT 6.1* 6.6  ALBUMIN 3.0* 3.0*   No results for input(s): LIPASE, AMYLASE in the last 168 hours. No results for input(s): AMMONIA in the last 168 hours. Coagulation Profile: No results for input(s): INR, PROTIME in the last 168 hours. Cardiac Enzymes:  Recent Labs Lab 12/13/15 0328 12/13/15 0913 12/13/15 1510 12/13/15 2123 12/14/15 0254  TROPONINI 0.08* 0.07* 0.05* 0.05* 0.04*   BNP (last 3 results) No results for input(s): PROBNP in the last 8760 hours. HbA1C: No results for input(s): HGBA1C in the last 72 hours. CBG:  Recent Labs Lab 12/12/15 2304 12/13/15 0756 12/13/15 1155 12/13/15 1651 12/14/15 0722  GLUCAP 171* 137* 147* 304* 182*   Lipid Profile: No results for input(s): CHOL, HDL, LDLCALC, TRIG, CHOLHDL, LDLDIRECT in the last 72 hours. Thyroid Function Tests:  Recent Labs  12/13/15 0328  TSH 1.063   Anemia Panel: No results for input(s): VITAMINB12, FOLATE, FERRITIN, TIBC, IRON, RETICCTPCT in the last 72 hours. Urine analysis: No results found for: COLORURINE, APPEARANCEUR, LABSPEC, PHURINE, GLUCOSEU, HGBUR, BILIRUBINUR, KETONESUR, PROTEINUR, UROBILINOGEN, NITRITE, LEUKOCYTESUR  Critical Care Time Spent:  15 mins  Elizardo Chilson M.D. [facility] 12/14/2015, 1:21 PM  Pager: [phone] Between 7am to 7pm - call Pager - [phone]  After 7pm go to [email or web address] - password [email or web address]  Call night coverage person covering after 7pm

## 2015-12-15 ENCOUNTER — Ambulatory Visit
Admit: 2015-12-15 | Discharge: 2015-12-15 | Disposition: A | Discharge status: Home / Self Care | Payer: PPO | Attending: Radiation Oncology | Admitting: Radiation Oncology

## 2015-12-15 ENCOUNTER — Encounter: Payer: PPO | Admitting: Thoracic Surgery (Cardiothoracic Vascular Surgery)

## 2015-12-15 DIAGNOSIS — I480 Paroxysmal atrial fibrillation: Secondary | ICD-10-CM | POA: Diagnosis present

## 2015-12-15 DIAGNOSIS — Z7189 Other specified counseling: Secondary | ICD-10-CM

## 2015-12-15 DIAGNOSIS — C3411 Malignant neoplasm of upper lobe, right bronchus or lung: Secondary | ICD-10-CM

## 2015-12-15 DIAGNOSIS — Z515 Encounter for palliative care: Secondary | ICD-10-CM

## 2015-12-15 DIAGNOSIS — Z510 Encounter for antineoplastic radiation therapy: Secondary | ICD-10-CM | POA: Diagnosis not present

## 2015-12-15 LAB — GLUCOSE, CAPILLARY
Glucose-Capillary: 151 mg/dL — ABNORMAL HIGH (ref 65–99)
Glucose-Capillary: 232 mg/dL — ABNORMAL HIGH (ref 65–99)
Glucose-Capillary: 144 mg/dL — ABNORMAL HIGH (ref 65–99)

## 2015-12-15 MED ORDER — DILTIAZEM HCL 25 MG/5ML IV SOLN
5.0000 mg | Freq: Once | INTRAVENOUS | Status: AC
  Administered 2015-12-15: 5 mg via INTRAVENOUS
  Filled 2015-12-15: qty 5

## 2015-12-15 MED ORDER — DILTIAZEM HCL ER COATED BEADS 240 MG PO CP24
240.0000 mg | ORAL_CAPSULE | Freq: Every day | ORAL | Status: DC
  Administered 2015-12-16 – 2015-12-18 (×3): 240 mg via ORAL
  Filled 2015-12-15 (×3): qty 1

## 2015-12-15 MED ORDER — SONAFINE EX EMUL
1.0000 "application " | Freq: Once | CUTANEOUS | Status: AC
  Administered 2015-12-15: 1 via TOPICAL

## 2015-12-15 MED ORDER — MORPHINE SULFATE ER 15 MG PO TBCR
15.0000 mg | EXTENDED_RELEASE_TABLET | Freq: Two times a day (BID) | ORAL | Status: DC
  Administered 2015-12-15 – 2015-12-16 (×3): 15 mg via ORAL
  Filled 2015-12-15 (×3): qty 1

## 2015-12-15 NOTE — Progress Notes (Signed)
[facility]                                                                              Patient Demographics  Katherine Bullock, is a 70 y.o. female, DOB - 1946/03/04, SLH:[id number]  Admit date - 12/10/2015   Admitting Physician Toy Baker, MD  Outpatient Primary MD for the patient is [facility], MD  Outpatient specialists:   LOS - 4  days   No chief complaint on file.     Brief summary  Katherine Bullock is a 70 y.o. female with recent diagnosis of mediastinal tumor biopsy results are pending concerning for stage III lung cancer presents to the ER at [facility] with worsening pain and upper back and chest and shortness of breath last evening. EKG was showing T-wave changes in the lateral leads with patient's blood pressure more than 157 systolic. Labs including troponin were unremarkable. Patient was placed on nitroglycerin infusion for blood pressure control and chest pain.  CT angiography of the chest done shows - severe progression of mediastinal tumor since June of this year. Was showing 14 cm centrally necrotic tumor with epicenter in the posterior mediastinum to the right midline with severe mass effect on the central airways and right pulmonary artery. Tumor inseparable aorta and ventral thoracic spine. No spinal information suspected by CT.  Patient was transferred for further management. By the time patient arrived patient is off nitroglycerin drip and blood pressure has improved. Chest pain and upper back pain is controlled with pain relief medication. Not in distress.  Pt started radiation treatments.    Assessment & Plan   Aggressive Mediastional Tumor / Acute respiratory failure with hypoxia with chest pain and upper back pain most likely secondary to severe progression of mediastinal tumor - - CT of the chest showed progression of the mediastinal tumor, severe mass effect on the central airways and right pulmonary artery. - Continue pain control,  discussed with Dr. Julien Nordmann, patient's primary oncologist, recommended transfer to [facility] hospital. Dr. Julien Nordmann also discussed with Dr. Tammi Klippel with radiation oncology. I spoke with Dr. Julien Nordmann 8/8 pt wants to pursue full aggressive therapy now.  Biopsy results pending.  - Pt had first radiation treatments 8/4, 8/5 and resume 8/7 and 8/8.   - Pt complained of increasing respiratory distress and uncontrolled pain 8/6.  CXR 8/5: no infiltrates or CHF seen. Continue oxygen support.  Made some changes to her medications and she is doing better now.     Back pain / Chest Pain - Pt says that pain not controlled.  Palliative medicine assisting with pain management.  Appreciate assistance.        Hypertensive urgency - resolved now. - suspect blood pressure was elevated secondary to severe uncontrolled pain. Now that pain is controlled, BP has been much better.  New Onset Atrial Fibrillation with RVR - likely precipitated by tumor pressing on heart, responded to IV diltiazem bolus but has recurred.   - diltiazem daily ordered.   - echo completed  - TSH normal.  - I called and spoke with cardiologist, He recommended to not anticoagulate at this time given she is  is currently undergoing aggressive radiation therapy and suspect subsequent tumor necrosis, monitor for recurrence of afib on tele, follow. He said to start on oral diltiazem.  She had recurrence of Afib with RVR 8/6 treated with bolus of diltiazem.  Now rate is controlled, increasing oral diltiazem to 240 mg.  Elevated cardiac enzymes likely demand ischemia, no chest pain or ischemic EKG changes seen.   Diabetes mellitus type 2, controlled ([place]) - Pt not really eating now.  Will reduce glucose testing.     COPD - symptoms exacerbated, change to duonebs every 4 hours and albuterol neb every 2 hours prn. Give 1 dose of solumedrol 8/6.    Severe protein calorie malnutrition - pt not eating well, continue to supplement as much as  possible.  Code Status: Full CODE STATUS DVT Prophylaxis:  SCD's Family Communication: family is torn about continuing aggressive treatments, daughter wants to focus on patient's comfort and dignity and son wants everything done.  At this time patient is back to [place] and wants full aggressive therapy, I spoke with Dr. Julien Nordmann 8/8 and updated him.  He says that final biopsy results not available yet.    Disposition Plan: Plan is for her to go home in 1-2 days  Procedures:  CT angiogram of the chest  Consultants:   Oncology, Dr. Julien Nordmann Radiation oncology Tammi Klippel  Antimicrobials:  Anti-infectives    None     Medications  Scheduled Meds: . amLODipine  5 mg Oral Daily  . aspirin  162 mg Oral Daily  . atorvastatin  40 mg Oral Daily  . citalopram  20 mg Oral Daily  . [START ON 12/16/2015] diltiazem  240 mg Oral Daily  . docusate sodium  100 mg Oral BID  . enoxaparin (LOVENOX) injection  40 mg Subcutaneous Q24H  . feeding supplement (GLUCERNA SHAKE)  237 mL Oral TID BM  . insulin aspart  0-9 Units Subcutaneous TID WC  . ipratropium-albuterol  3 mL Nebulization QID  . lisinopril  5 mg Oral Daily  . morphine  15 mg Oral Q12H  .  morphine injection  2 mg Intravenous Once  . spironolactone  12.5 mg Oral Daily   Continuous Infusions:   PRN Meds:.acetaminophen **OR** acetaminophen, albuterol, diltiazem, hydrALAZINE, morphine injection, ondansetron **OR** ondansetron (ZOFRAN) IV  Antibiotics   Anti-infectives    None     Subjective:   [name] Pt says that she feels better today, pain better controlled.   Objective:   Vitals:   12/15/15 0849 12/15/15 1058 12/15/15 1110 12/15/15 1417  BP: (!) 130/53 (!) 119/56  139/60  Pulse:  (!) 112  91  Resp:    20  Temp:    99.3 F (37.4 C)  TempSrc:    Oral  SpO2:   100% 98%  Weight:      Height:        Intake/Output Summary (Last 24 hours) at 12/15/15 1440 Last data filed at 12/15/15 1417  Gross per 24 hour  Intake               120 ml  Output              875 ml  Net             -755 ml    Wt Readings from Last 3 Encounters:  12/15/15 56.8 kg (125 lb 3.2 oz)  12/01/15 55.6 kg (122 lb 9.6 oz)  11/23/15 58.5 kg (129 lb)   Exam  General:  Alert and oriented x 3, Frail and appears ill and uncomfortable.   HEENT:  PERRLA, EOMI, Anicteric Sclera, mucous membranes moist.   Neck: Supple, no JVD, no masses   Cardiovascular: S1 S2 auscultated, no rubs, murmurs or gallops. Regular rate and rhythm.  Respiratory: diffuse increased bilateral expiratory wheezing  Gastrointestinal: Soft, nontender, nondistended, + bowel sounds  Ext: no cyanosis clubbing or edema  Neuro: AAOx3, Cr N's II- XII. Strength 5/5 upper and lower extremities bilaterally  Skin: No rashes  Psych: Normal affect and demeanor, alert and oriented x3   Data Reviewed:  I have personally reviewed following labs and imaging studies  Micro Results Recent Results (from the past 240 hour(s))  MRSA PCR Screening     Status: None   Collection Time: 12/10/15  4:00 PM  Result Value Ref Range Status   MRSA by PCR NEGATIVE NEGATIVE Final    Comment:        The GeneXpert MRSA Assay (FDA approved for NASAL specimens only), is one component of a comprehensive MRSA colonization surveillance program. It is not intended to diagnose MRSA infection nor to guide or monitor treatment for MRSA infections.     Radiology Reports Dg Chest 2 View  Result Date: 11/19/2015 CLINICAL DATA:  Preop for biopsy of right lung lesion EXAM: CHEST  2 VIEW COMPARISON:  Chest x-ray of 11/06/2015, PET-CT of 10/30/2015, and CT chest of 10/21/2005 FINDINGS: The right hilar and mediastinal mass noted on previous CT is unchanged by chest x-ray. No parenchymal infiltrate or pleural effusion is seen and no significant atelectasis is noted. Mediastinal and hilar contours are unchanged and the heart is within normal limits in size. No bony abnormality is seen. IMPRESSION:  No change in right hilar -mediastinal mass. No parenchymal infiltrate or pleural effusion is seen. Electronically Signed   By: Ivar Drape M.D.   On: 11/19/2015 11:51   Mr Jeri Cos SE Contrast  Result Date: 12/09/2015 CLINICAL DATA:  Lung cancer staging. EXAM: MRI HEAD WITHOUT AND WITH CONTRAST TECHNIQUE: Multiplanar, multiecho pulse sequences of the brain and surrounding structures were obtained without and with intravenous contrast. CONTRAST:  26m MULTIHANCE GADOBENATE DIMEGLUMINE 529 MG/ML IV SOLN COMPARISON:  MRI 10/12/2013 FINDINGS: Mild cerebral atrophy unchanged from the prior study. Negative for hydrocephalus Negative for acute infarct. Scattered white matter hyperintensities similar to the prior study and consistent with chronic microvascular ischemia. Chronic ischemic changes in the pons have progressed in the interval. Negative for intracranial hemorrhage.  No fluid collection. Negative for metastatic disease. No mass lesion or edema. Postcontrast imaging demonstrates no enhancing mass lesion. Incidental note is made of an enhancing small venous anomaly in the right parietal lobe. Normal enhancement of the dural venous sinuses. Paranasal sinuses clear. Normal orbital structures. Bilateral cataract extraction. Bilateral mastoid sinus effusion. IMPRESSION: Atrophy and chronic microvascular ischemic change.  No acute infarct Negative for metastatic disease to the brain Bilateral mastoid sinus effusion. Electronically Signed   By: CFranchot GalloM.D.   On: 12/09/2015 07:38   Dg Chest Port 1 View  Result Date: 12/13/2015 CLINICAL DATA:  70year old female with shortness of breath, COPD. EXAM: PORTABLE CHEST 1 VIEW COMPARISON:  Chest CT dated 12/09/2015 FINDINGS: There is emphysematous changes of the lungs. Bibasilar atelectatic changes/ scarring. There is no pleural effusion or pneumothorax. Large opacity abutting the right thoracic spine compatible with known posterior mediastinal mass. Top-normal  cardiac size. No acute osseous pathology. IMPRESSION: No acute cardiopulmonary process. Large right paraspinal opacity compatible with known  posterior mediastinal mass. Electronically Signed   By: Anner Crete M.D.   On: 12/13/2015 03:47   Lab Data:  CBC:  Recent Labs Lab 12/10/15 0348 12/12/15 1346 12/13/15 0328  WBC 9.9 14.1* 14.8*  NEUTROABS 5.7  --   --   HGB 10.8* 10.2* 10.4*  HCT 34.2* 31.2* 32.5*  MCV 92.4 92.9 92.1  PLT 390 490* 403*   Basic Metabolic Panel:  Recent Labs Lab 12/10/15 0348 12/12/15 1346 12/13/15 0328  NA 136 137 136  K 3.3* 3.9 3.8  CL 103 106 104  CO2 '25 24 26  '$ GLUCOSE 209* 85 114*  BUN 5* 12 8  CREATININE 0.69 0.63 0.60  CALCIUM 8.9 8.8* 8.5*   GFR: Estimated Creatinine Clearance: 56.5 mL/min (by C-G formula based on SCr of 0.8 mg/dL). Liver Function Tests:  Recent Labs Lab 12/10/15 0348 12/13/15 0328  AST 18 20  ALT 13* 13*  ALKPHOS 91 98  BILITOT 0.1* 0.7  PROT 6.1* 6.6  ALBUMIN 3.0* 3.0*   No results for input(s): LIPASE, AMYLASE in the last 168 hours. No results for input(s): AMMONIA in the last 168 hours. Coagulation Profile: No results for input(s): INR, PROTIME in the last 168 hours. Cardiac Enzymes:  Recent Labs Lab 12/13/15 0328 12/13/15 0913 12/13/15 1510 12/13/15 2123 12/14/15 0254  TROPONINI 0.08* 0.07* 0.05* 0.05* 0.04*   BNP (last 3 results) No results for input(s): PROBNP in the last 8760 hours. HbA1C: No results for input(s): HGBA1C in the last 72 hours. CBG:  Recent Labs Lab 12/14/15 1655 12/14/15 2117 12/14/15 2228 12/15/15 0745 12/15/15 1134  GLUCAP 183* 262* 260* 151* 232*   Lipid Profile: No results for input(s): CHOL, HDL, LDLCALC, TRIG, CHOLHDL, LDLDIRECT in the last 72 hours. Thyroid Function Tests:  Recent Labs  12/13/15 0328  TSH 1.063   Anemia Panel: No results for input(s): VITAMINB12, FOLATE, FERRITIN, TIBC, IRON, RETICCTPCT in the last 72 hours. Urine analysis: No  results found for: COLORURINE, APPEARANCEUR, LABSPEC, PHURINE, GLUCOSEU, HGBUR, BILIRUBINUR, KETONESUR, PROTEINUR, UROBILINOGEN, NITRITE, LEUKOCYTESUR  Critical Care Time Spent:  15 mins  Clanford Johnson M.D. [facility] 12/15/2015, 2:40 PM  Pager: [phone] Between 7am to 7pm - call Pager - [phone]  After 7pm go to [email or web address] - password [email or web address]  Call night coverage person covering after 7pm

## 2015-12-15 NOTE — Progress Notes (Signed)
[facility] Therapy Treatment Record Phone [phone]   Radiation Therapy was administered to Katherine Bullock on: 12/15/2015  12:31 PM and was treatment # 2 out of a planned course of 10 treatments.  Radiation Treatment  1). Beam photons 11-19 energy     Lasnicki,Fallyn Munnerlyn, RT (T)

## 2015-12-15 NOTE — Progress Notes (Signed)
Pt noted to be back in Afib this am. HR ranging from 110-140 when first went into afib but is now 100-110's. MD made aware and orders received for a one time dose of '5mg'$  IV Cardizem and MD increased po Cardizem dose to start tomorrow. Pt asymptomatic and BP 130/53. Will continue to monitor.   Othella Boyer [facility] 12/15/2015 10:01 AM

## 2015-12-15 NOTE — Progress Notes (Signed)
  Radiation Oncology         [phone] ________________________________  Name: Katherine Bullock MRN: [id number]  Date: 12/15/2015  DOB: 1945-12-23  Weekly Radiation Therapy Management - INPATIENT    ICD-9-CM ICD-10-CM   1. Primary cancer of right upper lobe of lung (HCC) 162.3 C34.11     Current Dose: 12 Gy     Planned Dose:  28 Gy  Narrative . . . . . . . . The patient presents for routine under treatment assessment.                            Katherine Bullock has completed 4 fractions to her right lung.  She reports having pain in her mid-back that is being controled with IV and PO pain medications.  She reports having shortness of breath and is using 3 L of oxygen.  She reports having a cough and hemoptysis, but reports a decrease in the amount of blood since starting radiation.  She is anxious to find out the results from her biopsy from 11/23/15.                                 Set-up films were reviewed.                                 The chart was checked. Physical Findings. . [name] - 1 value per visit 01/14/4209  SYSTOLIC 312  DIASTOLIC 60  Pulse 91  Temperature 99.3  Respirations 20  Weight (lb) 125.2  Height   BMI   VISIT REPORT    3L of oxygen via nasal cannula. The patient has lost 4 lbs since yesterday. Lungs are clear to auscultation bilaterally. Heart has regular rate and rhythm. Lying comfortably in a hospital bed. Accompanied by a family member. Impression . . . . . . . The patient is tolerating radiation. Stable at this time. Plan . . . . . . . . . . . . Continue palliative radiotherapy as planned. Final pathology from 11/23/15 is pending. ________________________________   Blair Promise, PhD, MD  This document serves as a record of services personally performed by Gery Pray, MD. It was created on his behalf by Darcus Austin, a trained medical scribe. The creation of this record is based on the scribe's personal observations and the provider's statements to them.  This document has been checked and approved by the attending provider.

## 2015-12-15 NOTE — Progress Notes (Signed)
This encounter was created in error - please disregard.

## 2015-12-15 NOTE — Progress Notes (Signed)
Pt here for patient teaching.  Pt given Radiation and You booklet, skin care instructions and Sonafine. Pt reports they have not watched the Radiation Therapy Education video and has been given the link to watch at home.  Reviewed areas of pertinence such as fatigue, skin changes and throat changes . Pt able to give teach back of to pat skin and use unscented/gentle soap,apply Sonafine bid and avoid applying anything to skin within 4 hours of treatment. Pt demonstrated understanding and verbalizes understanding of information given and will contact nursing with any questions or concerns.

## 2015-12-15 NOTE — Progress Notes (Signed)
Daily Progress Note   Patient Name: Katherine Bullock       Date: 12/15/2015 DOB: February 21, 1946  Age: 70 y.o. MRN#: [id number] Attending Physician: Murlean Iba, MD Primary Care Physician: Marco Collie, MD Admit Date: 12/10/2015  Reason for Consultation/Follow-up: Establishing goals of care, Non pain symptom management and Psychosocial/spiritual support  Subjective:  Katherine Bullock is awake and alert. She is sitting up in bed. She has cough and some hemoptysis going on. She has some baseline dyspnea evident even at rest. She denies any chest pain. Son and daughter present at the bedside. See discussions below: Length of Stay: 4  Current Medications: Scheduled Meds:  . amLODipine  5 mg Oral Daily  . aspirin  162 mg Oral Daily  . atorvastatin  40 mg Oral Daily  . citalopram  20 mg Oral Daily  . [START ON 12/16/2015] diltiazem  240 mg Oral Daily  . diltiazem  5 mg Intravenous Once  . docusate sodium  100 mg Oral BID  . enoxaparin (LOVENOX) injection  40 mg Subcutaneous Q24H  . feeding supplement (GLUCERNA SHAKE)  237 mL Oral TID BM  . insulin aspart  0-9 Units Subcutaneous TID WC  . ipratropium-albuterol  3 mL Nebulization QID  . lisinopril  5 mg Oral Daily  . morphine  15 mg Oral Q12H  .  morphine injection  2 mg Intravenous Once  . spironolactone  12.5 mg Oral Daily    Continuous Infusions:    PRN Meds: acetaminophen **OR** acetaminophen, albuterol, diltiazem, hydrALAZINE, morphine injection, ondansetron **OR** ondansetron (ZOFRAN) IV  Physical Exam         Elderly appearing lady resting in bed S1-S2 Shallow respirations Awake alert oriented Muscle wasting, cancer related cachexia evident No edema  Vital Signs: BP (!) 130/53   Pulse 80   Temp 98.3 F (36.8 C) (Oral)   Resp 18    Ht '5\' 4"'$  (1.626 m)   Wt 56.8 kg (125 lb 3.2 oz)   SpO2 100%   BMI 21.49 kg/m  SpO2: SpO2: 100 % O2 Device: O2 Device: Nasal Cannula O2 Flow Rate: O2 Flow Rate (L/min): 3 L/min  Intake/output summary:  Intake/Output Summary (Last 24 hours) at 12/15/15 1014 Last data filed at 12/15/15 0015  Gross per 24 hour  Intake  480 ml  Output              950 ml  Net             -470 ml   LBM: Last BM Date: 12/12/15 Baseline Weight: Weight: 56.7 kg (125 lb) Most recent weight: Weight: 56.8 kg (125 lb 3.2 oz)       Palliative Assessment/Data:    Flowsheet Rows   Flowsheet Row Most Recent Value  Intake Tab  Referral Department  Hospitalist  Unit at Time of Referral  Oncology Unit  Palliative Care Primary Diagnosis  Cancer  Date Notified  12/13/15  Palliative Care Type  Return patient Palliative Care  Reason for referral  Non-pain Symptom, Clarify Goals of Care  Date of Admission  12/10/15  Date first seen by Palliative Care  12/14/15  # of days Palliative referral response time  1 Day(s)  # of days IP prior to Palliative referral  3  Clinical Assessment  Palliative Performance Scale Score  30%  Pain Max last 24 hours  6  Pain Min Last 24 hours  5  Dyspnea Max Last 24 Hours  6  Dyspnea Min Last 24 hours  5  Nausea Min Last 24 Hours  5  Anxiety Max Last 24 Hours  4  Psychosocial & Spiritual Assessment  Palliative Care Outcomes  Patient/Family meeting held?  Yes  Who was at the meeting?  patient, daughter, son.   Palliative Care Outcomes  Clarified goals of care, Improved non-pain symptom therapy  Palliative Care follow-up planned  Yes, Home  Palliative Care Follow-up Reason  Advanced care planning, Clarify goals of care, Non-pain symptom      Patient Active Problem List   Diagnosis Date Noted  . Dyspnea   . Primary cancer of right upper lobe of lung ([place]) 12/11/2015  . Acute respiratory failure with hypoxia ([place]) 12/10/2015  . Back pain 12/10/2015  .  Hypertensive urgency 12/10/2015  . Diabetes mellitus type 2, controlled ([place]) 12/10/2015  . Acute respiratory failure ([place]) 12/10/2015  . COPD, mild ([place]) 12/01/2015  . Alpha-1-antitrypsin deficiency carrier ([place]) 11/06/2015  . Lung mass 10/27/2015  . Emphysema of lung ([place]) 10/27/2015  . Tobacco use disorder 10/27/2015  . Occlusion and stenosis of carotid artery without mention of cerebral infarction 12/06/2013  . CVA (cerebral vascular accident) ([place]) 12/06/2013  . Chronic periscapular pain 09/19/2011    Palliative Care Assessment & Plan   Patient Profile:    Assessment:  Mediastinal tumor Lung cancer Cough, hemoptysis, shortness of breath-extensive symptom burden   Recommendations/Plan:  Appreciate chaplain follow-up. Patient wishes to designate her daughter Katherine Bullock to be her healthcare power of attorney agent  Start MS Contin 15 mg by mouth every 12 hours scheduled medication. Continue IV morphine when necessary. Patient with escalating dosages of IV morphine when necessary. Continue to monitor. May need to have opioid rotation to hydromorphone-Dilaudid IV within the next 24 hours.  Patient and family are requesting more information from medical oncology and radiation oncology with regards to issues pertaining to prognosis, extent of her cancer etc.  Full code for now. Discussed extensively about CODE STATUS. Discussed extensively about medical recommendation for establishing DO NOT RESUSCITATE/DO NOT INTUBATE given the patient's cancer and rapidly declining functional status and increased to symptom burden. We will follow-up on 8-9. Call placed and discussed with Dr. Wynetta Emery from [facility].      Code Status:    Code Status Orders  Start     Ordered   12/15/15 1010  Full code  Continuous     12/15/15 1009    Code Status History    Date Active Date Inactive Code Status Order ID Comments User Context   12/14/2015  1:16 PM 12/14/2015  2:46 PM DNR [id number]   Murlean Iba, MD Inpatient   12/11/2015  4:13 PM 12/11/2015  4:31 PM DNR [id number]  Murlean Iba, MD Inpatient   12/10/2015  2:44 AM 12/10/2015  3:13 PM Full Code [id number]  Rise Patience, MD Inpatient       Prognosis:   < 6 months  Discharge Planning:  To Be Determined  Care plan was discussed with  Patient, son and daughter.   Thank you for allowing the Palliative Medicine Team to assist in the care of this patient.   Time In: 9 Time Out: 935 Total Time 35 Prolonged Time Billed  no       Greater than 50%  of this time was spent counseling and coordinating care related to the above assessment and plan.  Loistine Chance, MD [phone]   Please contact Palliative Medicine Team phone at [phone] for questions and concerns.

## 2015-12-15 NOTE — Progress Notes (Signed)
Katherine Bullock has completed 4 fractions to her right lung.  She reports having pain in her mid back area that is being controled with IV and PO pain medications.  She reports having shortness of breath and is using 3 L of oxygen.  She reports having a cough and hemoptysis but reports a decrease in the amount of blood since starting radiation.  She is anxious to find out the results from her recent biopsy.

## 2015-12-15 NOTE — Progress Notes (Signed)
   12/15/15 0900  Clinical Encounter Type  Visited With Patient and family together  Visit Type Initial;Psychological support;Spiritual support  Referral From Chaplain  Consult/Referral To Chaplain  Spiritual Encounters  Spiritual Needs Other (Comment);Brochure Financial controller)  Stress Factors  Patient Stress Factors Other (Comment) Production manager )  Regulatory affairs officer (For Healthcare)  Does patient have an advance directive? No  Would patient like information on creating an advanced directive? Yes - Scientist, clinical (histocompatibility and immunogenetics) given   I visited with the patient per referral by one of the other Chaplains. Patient was requesting information on Advance Directives; specifically [name].  I provided the patient with the document. The patient wants her daughter to be her Press photographer. The patient and her daughter want to discuss the document. I let them know that they can have staff page [place] when they are ready to complete the document or if they have any questions.   Please, contact Spiritual Care for further assistance.    [place] M.Div.

## 2015-12-16 ENCOUNTER — Ambulatory Visit
Admit: 2015-12-16 | Discharge: 2015-12-16 | Disposition: A | Discharge status: Home / Self Care | Payer: PPO | Attending: Radiation Oncology | Admitting: Radiation Oncology

## 2015-12-16 DIAGNOSIS — J9601 Acute respiratory failure with hypoxia: Secondary | ICD-10-CM

## 2015-12-16 DIAGNOSIS — I480 Paroxysmal atrial fibrillation: Secondary | ICD-10-CM

## 2015-12-16 DIAGNOSIS — C3411 Malignant neoplasm of upper lobe, right bronchus or lung: Principal | ICD-10-CM

## 2015-12-16 DIAGNOSIS — E1121 Type 2 diabetes mellitus with diabetic nephropathy: Secondary | ICD-10-CM

## 2015-12-16 DIAGNOSIS — M5440 Lumbago with sciatica, unspecified side: Secondary | ICD-10-CM

## 2015-12-16 DIAGNOSIS — I160 Hypertensive urgency: Secondary | ICD-10-CM

## 2015-12-16 DIAGNOSIS — J449 Chronic obstructive pulmonary disease, unspecified: Secondary | ICD-10-CM

## 2015-12-16 LAB — GLUCOSE, CAPILLARY
GLUCOSE-CAPILLARY: 184 mg/dL — AB (ref 65–99)
GLUCOSE-CAPILLARY: 150 mg/dL — AB (ref 65–99)
GLUCOSE-CAPILLARY: 270 mg/dL — AB (ref 65–99)
GLUCOSE-CAPILLARY: 83 mg/dL (ref 65–99)
Glucose-Capillary: 193 mg/dL — ABNORMAL HIGH (ref 65–99)

## 2015-12-16 MED ORDER — INSULIN ASPART 100 UNIT/ML ~~LOC~~ SOLN
0.0000 [IU] | Freq: Three times a day (TID) | SUBCUTANEOUS | Status: DC
  Administered 2015-12-16: 5 [IU] via SUBCUTANEOUS
  Administered 2015-12-17 – 2015-12-18 (×4): 2 [IU] via SUBCUTANEOUS
  Administered 2015-12-18: 3 [IU] via SUBCUTANEOUS

## 2015-12-16 MED ORDER — INSULIN ASPART 100 UNIT/ML ~~LOC~~ SOLN
3.0000 [IU] | Freq: Three times a day (TID) | SUBCUTANEOUS | Status: DC
  Administered 2015-12-16 – 2015-12-17 (×4): 3 [IU] via SUBCUTANEOUS

## 2015-12-16 MED ORDER — MORPHINE SULFATE ER 30 MG PO TBCR
30.0000 mg | EXTENDED_RELEASE_TABLET | Freq: Two times a day (BID) | ORAL | Status: DC
  Administered 2015-12-16 – 2015-12-18 (×4): 30 mg via ORAL
  Filled 2015-12-16 (×4): qty 1

## 2015-12-16 MED ORDER — INSULIN ASPART 100 UNIT/ML ~~LOC~~ SOLN: 0.0000 [IU] | Freq: Every day | SUBCUTANEOUS | Status: DC

## 2015-12-16 NOTE — Progress Notes (Signed)
Chaplain completed [place] with pt.

## 2015-12-16 NOTE — Progress Notes (Signed)
[facility] Therapy Treatment Record Phone [phone]   Radiation Therapy was administered to Katherine Bullock on: 12/16/2015  12:41 PM and was treatment # 3 out of a planned course of 10 treatments.  Radiation Treatment  1). Beam photons with 6-10 energy  2). Brachytherapy none  3). Stereotactic Radiosurgery None  4). Other Radiation None     Romesha Scherer S, RT (T)

## 2015-12-16 NOTE — Progress Notes (Signed)
Progress Note    Katherine Bullock  MCN:[id number] DOB: 1945/12/26  DOA: 12/10/2015 PCP: Marco Collie, MD    Brief Narrative:   Katherine Bullock is an 70 y.o. female with recent diagnosis of mediastinal tumor Who initially presented to [facility] for evaluation of upper back/chest pain associated with shortness of breath. EKG showed T-wave changes in the lateral leads with patient's blood pressure elevated to more than 629 systolic. Labs including troponin were unremarkable. Patient was placed on nitroglycerin infusion for blood pressure control and treatment of chest pain. CT of the chest was obtained which showed marked progression of her mediastinal tumor with mass effect on the central airways and right pulmonary artery. Patient was transferred to [facility] 12/10/15 for further management and initiation of radiation therapy.  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 Oncology/radiation oncology consulted for initiation of radiation treatment. She has completed 3/10 treatments. She continues to need oxygen and her respiratory status is tenuous given extrinsic compression of the central airways. The palliative care team has been following her. Her MS Contin has been titrated to achieve better pain control.    Hypertensive urgency Resolved after being placed on a nitroglycerin drip. Possibly triggered by uncontrolled pain.    New Onset Atrial Fibrillation with RVR with demand ischemia Likely related to distortion of her anatomy secondary to mass effect of mediastinal tumor. TSH WNL. No current recommendations for blood thinners given aggressive radiation therapy and likely initiation of chemotherapy which will cause bone marrow suppression and be risky. Converted to  normal sinus rhythm overnight. Continue oral diltiazem. Elevated troponins felt to be secondary to demand ischemia. Continue with rate control strategy.  Active problems:    Diabetes mellitus type 2, controlled ([place]) Currently being managed with insulin sensitive SSI 3 times a day. CBGs [id number]. Will add 3 units of meal coverage.     COPD Continue duonebs every 4 hours and albuterol neb every 2 hours prn. Given 1 dose of solumedrol 12/13/15.      Severe protein calorie malnutrition Continue nutritional supplements.  Family Communication/Anticipated D/C date and plan/Code Status   DVT prophylaxis: Lovenox ordered. Code Status: Full Code.  Family Communication: No family at bedside. Disposition Plan: Will obtain PT evaluation to determine if patient can safely be discharged home. Likely will need another few days in the hospital.   Medical Consultants:    Radiation Oncology  Palliative Care   Procedures:    Anti-Infectives:   Anti-infectives    None      Subjective:    Katherine Bullock complains of gas and has not moved her bowels in several days. She continues to have occasional pain and has used approximately 20 mg of morphine over the past 24 hours. No current complaints of nausea or vomiting. Appetite fair at best.  Objective:    Vitals:   12/15/15 2124 12/15/15 2146 12/16/15 0538 12/16/15 0805  BP:  128/60 (!) 142/81   Pulse:  (!) 109 98   Resp:  18 16   Temp:  99.1 F (37.3 C) 98.5 F (36.9 C)   TempSrc:  Oral Oral   SpO2: 95% 94% 98% 97%  Weight:   55.7 kg (122 lb 12.8 oz)   Height:        Intake/Output  Summary (Last 24 hours) at 12/16/15 0857 Last data filed at 12/16/15 0542  Gross per 24 hour  Intake              360 ml  Output             1825 ml  Net            -1465 ml   Filed Weights   12/14/15 0500 12/15/15 0528 12/16/15 0538  Weight: 58.5 kg (129 lb) 56.8 kg (125 lb 3.2 oz) 55.7 kg (122 lb 12.8 oz)    Exam: General exam: Appears calm  and comfortable. Thin with muscle wasting/cachexia. Respiratory system: Clear to auscultation. Respiratory effort normal. Cardiovascular system: S1 & S2 heard, RRR. No JVD,  rubs, gallops or clicks. No murmurs. Gastrointestinal system: Abdomen is nondistended, soft and nontender. No organomegaly or masses felt. Normal bowel sounds heard. Central nervous system: Alert and oriented to self/place. No focal neurological deficits. Extremities: No clubbing,  or cyanosis. No edema. Skin: No rashes, lesions or ulcers. Psychiatry: Judgement and insight appear normal. Mood & affect appropriate.   Data Reviewed:   I have personally reviewed following labs and imaging studies:  Labs: Basic Metabolic Panel:  Recent Labs Lab 12/10/15 0348 12/12/15 1346 12/13/15 0328  NA 136 137 136  K 3.3* 3.9 3.8  CL 103 106 104  CO2 '25 24 26  '$ GLUCOSE 209* 85 114*  BUN 5* 12 8  CREATININE 0.69 0.63 0.60  CALCIUM 8.9 8.8* 8.5*   GFR Estimated Creatinine Clearance: 56.5 mL/min (by C-G formula based on SCr of 0.8 mg/dL). Liver Function Tests:  Recent Labs Lab 12/10/15 0348 12/13/15 0328  AST 18 20  ALT 13* 13*  ALKPHOS 91 98  BILITOT 0.1* 0.7  PROT 6.1* 6.6  ALBUMIN 3.0* 3.0*   CBC:  Recent Labs Lab 12/10/15 0348 12/12/15 1346 12/13/15 0328  WBC 9.9 14.1* 14.8*  NEUTROABS 5.7  --   --   HGB 10.8* 10.2* 10.4*  HCT 34.2* 31.2* 32.5*  MCV 92.4 92.9 92.1  PLT 390 490* 467*   Cardiac Enzymes:  Recent Labs Lab 12/13/15 0328 12/13/15 0913 12/13/15 1510 12/13/15 2123 12/14/15 0254  TROPONINI 0.08* 0.07* 0.05* 0.05* 0.04*   CBG:  Recent Labs Lab 12/15/15 0745 12/15/15 1134 12/15/15 1723 12/15/15 2148 12/16/15 0746  GLUCAP 151* 232* 144* 193* 184*   Microbiology Recent Results (from the past 240 hour(s))  MRSA PCR Screening     Status: None   Collection Time: 12/10/15  4:00 PM  Result Value Ref Range Status   MRSA by PCR NEGATIVE NEGATIVE Final    Comment:        The  GeneXpert MRSA Assay (FDA approved for NASAL specimens only), is one component of a comprehensive MRSA colonization surveillance program. It is not intended to diagnose MRSA infection nor to guide or monitor treatment for MRSA infections.     Radiology: No results found.  Medications:   . amLODipine  5 mg Oral Daily  . aspirin  162 mg Oral Daily  . atorvastatin  40 mg Oral Daily  . citalopram  20 mg Oral Daily  . diltiazem  240 mg Oral Daily  . docusate sodium  100 mg Oral BID  . enoxaparin (LOVENOX) injection  40 mg Subcutaneous Q24H  . feeding supplement (GLUCERNA SHAKE)  237 mL Oral TID BM  . insulin aspart  0-9 Units Subcutaneous TID WC  . ipratropium-albuterol  3 mL Nebulization QID  . lisinopril  5 mg Oral Daily  . morphine  15 mg Oral Q12H  .  morphine injection  2 mg Intravenous Once  . spironolactone  12.5 mg Oral Daily   Continuous Infusions:   Time spent: 25 minutes.   LOS: 5 days   [facility] Pager [phone]. If unable to reach me by pager, please call my cell phone at [phone].  *Please refer to [email or web address], password [email or web address] to get updated schedule on who will round on this patient, as hospitalists switch teams weekly. If 7PM-7AM, please contact night-coverage at [email or web address], password [email or web address] for any overnight needs.  12/16/2015, 8:57 AM

## 2015-12-16 NOTE — Progress Notes (Signed)
Daily Progress Note   Patient Name: Katherine Bullock       Date: 12/16/2015 DOB: 1945-09-30  Age: 70 y.o. MRN#: [id number] Attending Physician: Venetia Maxon Rama, MD Primary Care Physician: Katherine Collie, MD Admit Date: 12/10/2015  Reason for Consultation/Follow-up: Establishing goals of care, Non pain symptom management and Psychosocial/spiritual support  Subjective:  Katherine Bullock is awake and alert. She is sitting up in bed. Daughter Katherine Bullock at the bedside.  Patient is in good spirits, she wants to go home.  Symptoms of cough dyspnea and hemoptysis are well controlled this am.  She has required total of 20 mg IV Morphine in the past 24 hours, she has also been started on MS contin 15 mg PO BID as of 12-15-15.   See below See discussions below: Length of Stay: 5  Current Medications: Scheduled Meds:  . amLODipine  5 mg Oral Daily  . aspirin  162 mg Oral Daily  . atorvastatin  40 mg Oral Daily  . citalopram  20 mg Oral Daily  . diltiazem  240 mg Oral Daily  . docusate sodium  100 mg Oral BID  . enoxaparin (LOVENOX) injection  40 mg Subcutaneous Q24H  . feeding supplement (GLUCERNA SHAKE)  237 mL Oral TID BM  . insulin aspart  0-9 Units Subcutaneous TID WC  . ipratropium-albuterol  3 mL Nebulization QID  . lisinopril  5 mg Oral Daily  . morphine  30 mg Oral Q12H  .  morphine injection  2 mg Intravenous Once  . spironolactone  12.5 mg Oral Daily    Continuous Infusions:    PRN Meds: acetaminophen **OR** acetaminophen, albuterol, diltiazem, hydrALAZINE, morphine injection, ondansetron **OR** ondansetron (ZOFRAN) IV  Physical Exam         Elderly appearing lady resting in bed S1-S2  clear anterior respirations Awake alert oriented Muscle wasting, cancer related cachexia evident No  edema  Vital Signs: BP (!) 142/81 (BP Location: Right Arm)   Pulse 98   Temp 98.5 F (36.9 C) (Oral)   Resp 16   Ht '5\' 4"'$  (1.626 m)   Wt 55.7 kg (122 lb 12.8 oz)   SpO2 97%   BMI 21.08 kg/m  SpO2: SpO2: 97 % O2 Device: O2 Device: Nasal Cannula O2 Flow Rate: O2 Flow Rate (L/min): 3 L/min  Intake/output summary:  Intake/Output Summary (Last 24 hours) at 12/16/15 0936 Last data filed at 12/16/15 0542  Gross per 24 hour  Intake              360 ml  Output             1825 ml  Net            -1465 ml   LBM: Last BM Date: 12/11/15 Baseline Weight: Weight: 56.7 kg (125 lb) Most recent weight: Weight: 55.7 kg (122 lb 12.8 oz)       Palliative Assessment/Data:    Flowsheet Rows   Flowsheet Row Most Recent Value  Intake Tab  Referral Department  Hospitalist  Unit at Time of Referral  Oncology Unit  Palliative Care Primary Diagnosis  Cancer  Date Notified  12/13/15  Palliative Care Type  Return patient Palliative Care  Reason for referral  Non-pain Symptom, Clarify Goals of Care  Date of Admission  12/10/15  Date first seen by Palliative Care  12/14/15  # of days Palliative referral response time  1 Day(s)  # of days IP prior to Palliative referral  3  Clinical Assessment  Palliative Performance Scale Score  30%  Pain Max last 24 hours  6  Pain Min Last 24 hours  5  Dyspnea Max Last 24 Hours  6  Dyspnea Min Last 24 hours  5  Nausea Min Last 24 Hours  5  Anxiety Max Last 24 Hours  4  Psychosocial & Spiritual Assessment  Palliative Care Outcomes  Patient/Family meeting held?  Yes  Who was at the meeting?  patient, daughter, son.   Palliative Care Outcomes  Clarified goals of care, Improved non-pain symptom therapy  Palliative Care follow-up planned  Yes, Home  Palliative Care Follow-up Reason  Advanced care planning, Clarify goals of care, Non-pain symptom      Patient Active Problem List   Diagnosis Date Noted  . Encounter for palliative care   . Goals of  care, counseling/discussion   . Paroxysmal atrial fibrillation (HCC)   . Dyspnea   . Primary cancer of right upper lobe of lung ([place]) 12/11/2015  . Acute respiratory failure with hypoxia ([place]) 12/10/2015  . Back pain 12/10/2015  . Hypertensive urgency 12/10/2015  . Diabetes mellitus type 2, controlled ([place]) 12/10/2015  . Acute respiratory failure ([place]) 12/10/2015  . COPD, mild ([place]) 12/01/2015  . Alpha-1-antitrypsin deficiency carrier ([place]) 11/06/2015  . Lung mass 10/27/2015  . Emphysema of lung ([place]) 10/27/2015  . Tobacco use disorder 10/27/2015  . Occlusion and stenosis of carotid artery without mention of cerebral infarction 12/06/2013  . CVA (cerebral vascular accident) ([place]) 12/06/2013  . Chronic periscapular pain 09/19/2011    Palliative Care Assessment & Plan   Patient Profile:    Assessment:  Mediastinal tumor Lung cancer Cough, hemoptysis, shortness of breath-extensive symptom burden   Recommendations/Plan:  Appreciate chaplain follow-up. Patient has designated her daughter Katherine Bullock to be her healthcare power of attorney agent  Titrate MS Contin to 30 mg by mouth every 12 hours scheduled medication. Continue IV morphine when necessary.      Patient and family have discussed with both medical oncology and radiation oncology with regards to issues pertaining to prognosis, extent of her cancer etc. On 12-15-15.   Full code for now. Discussed extensively about CODE STATUS. Discussed extensively about medical recommendation for establishing DO NOT RESUSCITATE/DO NOT INTUBATE given the patient's cancer and rapidly declining functional status and increased to symptom burden. Remains  full code for now. Wishes to continue with palliative radiation treatments, wishes to follow up with Dr Earlie Server on d/c.    Code Status:    Code Status Orders        Start     Ordered   12/15/15 1010  Full code  Continuous     12/15/15 1009    Code Status History    Date Active Date  Inactive Code Status Order ID Comments User Context   12/14/2015  1:16 PM 12/14/2015  2:46 PM DNR [id number]  Murlean Iba, MD Inpatient   12/11/2015  4:13 PM 12/11/2015  4:31 PM DNR [id number]  Murlean Iba, MD Inpatient   12/10/2015  2:44 AM 12/10/2015  3:13 PM Full Code [id number]  Rise Patience, MD Inpatient       Prognosis:   < 6 months?  Discharge Planning:  Home with home health for now.   Care plan was discussed with  Patient, and daughter.   Thank you for allowing the Palliative Medicine Team to assist in the care of this patient.   Time In: 9 Time Out: 925 Total Time 25 Prolonged Time Billed  no       Greater than 50%  of this time was spent counseling and coordinating care related to the above assessment and plan.  Loistine Chance, MD [phone]   Please contact Palliative Medicine Team phone at [phone] for questions and concerns.

## 2015-12-17 ENCOUNTER — Ambulatory Visit
Admit: 2015-12-17 | Discharge: 2015-12-17 | Disposition: A | Discharge status: Home / Self Care | Payer: PPO | Attending: Radiation Oncology | Admitting: Radiation Oncology

## 2015-12-17 LAB — GLUCOSE, CAPILLARY
GLUCOSE-CAPILLARY: 177 mg/dL — AB (ref 65–99)
GLUCOSE-CAPILLARY: 179 mg/dL — AB (ref 65–99)
GLUCOSE-CAPILLARY: 176 mg/dL — AB (ref 65–99)
Glucose-Capillary: 214 mg/dL — ABNORMAL HIGH (ref 65–99)

## 2015-12-17 MED ORDER — POLYETHYLENE GLYCOL 3350 17 G PO PACK
17.0000 g | PACK | Freq: Every day | ORAL | Status: DC
  Administered 2015-12-17 – 2015-12-18 (×2): 17 g via ORAL
  Filled 2015-12-17 (×2): qty 1

## 2015-12-17 MED ORDER — MORPHINE SULFATE (CONCENTRATE) 10 MG/0.5ML PO SOLN
10.0000 mg | ORAL | Status: DC | PRN
  Administered 2015-12-17 – 2015-12-18 (×3): 10 mg via ORAL
  Filled 2015-12-17 (×4): qty 0.5

## 2015-12-17 MED ORDER — SENNA 8.6 MG PO TABS
1.0000 | ORAL_TABLET | Freq: Two times a day (BID) | ORAL | Status: DC
  Administered 2015-12-17 – 2015-12-18 (×3): 8.6 mg via ORAL
  Filled 2015-12-17 (×3): qty 1

## 2015-12-17 MED ORDER — MORPHINE SULFATE (PF) 4 MG/ML IV SOLN: 4.0000 mg | INTRAVENOUS | Status: DC | PRN

## 2015-12-17 MED ORDER — PREMIER PROTEIN SHAKE
11.0000 [oz_av] | Freq: Two times a day (BID) | ORAL | Status: DC
  Administered 2015-12-18: 11 [oz_av] via ORAL
  Filled 2015-12-17: qty 325.31

## 2015-12-17 MED ORDER — IPRATROPIUM-ALBUTEROL 0.5-2.5 (3) MG/3ML IN SOLN
3.0000 mL | Freq: Three times a day (TID) | RESPIRATORY_TRACT | Status: DC
  Administered 2015-12-18: 3 mL via RESPIRATORY_TRACT
  Filled 2015-12-17 (×2): qty 3

## 2015-12-17 NOTE — Consult Note (Signed)
   [facility] CM Inpatient Consult   12/17/2015  Katherine Bullock 01/30/1946 [id number]   Went to bedside to speak with [name] and daughter about [facility] [employer] program on behalf of her [employer] insurance. Explained and offer services. [name] pleasantly declines. Explained to her that she is eligible for [facility] [employer] program should she become interested in the future. Provided [facility] Care Management packet and contact information for her to call. Appreciative of visit.   Marthenia Rolling, MSN-Ed, RN,BSN [facility] Liaison [phone]

## 2015-12-17 NOTE — Progress Notes (Signed)
Nutrition Follow-up  DOCUMENTATION CODES:   Severe malnutrition in context of chronic illness  INTERVENTION:   D/c Glucerna Shakes per pt request Provide Premier Protein supplements BID, each provides 160 kcal and 30 g protein. Encourage PO intake RD to continue to monitor  NUTRITION DIAGNOSIS:   Malnutrition related to chronic illness as evidenced by severe depletion of muscle mass, severe depletion of body fat.  Ongoing.  GOAL:   Patient will meet greater than or equal to 90% of their needs  Progressing.  MONITOR:   PO intake, Supplement acceptance, I & O's  REASON FOR ASSESSMENT:   Malnutrition Screening Tool    ASSESSMENT:   70 y.o. female with recent diagnosis of mediastinal tumor biopsy results are pending concerning for stage III lung cancer presents to the ER at [facility] with worsening pain in upper back and chest and shortness of breath. CT angiography of the chest done shows severe progression of mediastinal tumor since June of this year.  Patient in room with daughter at bedside. Per documentation, pt has eaten 60-100% of meals in the past 24 hours. She has been snacking on candy and cookies. She had 1/2 a tenderloin biscuit this morning. Pt states she felt hungry last night and this morning but in general her appetite has remained poor. Pt did drink a Glucerna shake mixed with ice cream yesterday but does not like them. Pt is willing to try Premier Protein shakes. RD to order.  Medications: Miralax daily, Senokot tablet BID Labs reviewed: CBGs: 83-179  Diet Order:  Diet regular Room service appropriate? Yes; Fluid consistency: Thin  Skin:  Reviewed, no issues  Last BM:  8/6  Height:   Ht Readings from Last 1 Encounters:  12/10/15 '5\' 4"'$  (1.626 m)    Weight:   Wt Readings from Last 1 Encounters:  12/17/15 128 lb 8 oz (58.3 kg)    Ideal Body Weight:  54.5 kg  BMI:  Body mass index is 22.06 kg/m.  Estimated Nutritional Needs:    Kcal:  1800-2000  Protein:  85-100 gm  Fluid:  1.8-2 L  EDUCATION NEEDS:   No education needs identified at this time  Clayton Bibles, MS, RD, LDN Pager: [phone] After Hours Pager: [phone]

## 2015-12-17 NOTE — Progress Notes (Signed)
[facility] Therapy Treatment Record Phone [phone]   Radiation Therapy was administered to Marlou Porch on: 12/17/2015  2:14 PM and was treatment # 4 out of a planned course of 10 treatments.  Radiation Treatment  1). Beam Photons 10-19 MeV  2). Brachytherapy   3). Stereotactic Radiosurgery   4). Other Radiation      Lasnicki,Gerrianne Aydelott, RT (T)

## 2015-12-17 NOTE — Evaluation (Signed)
Physical Therapy One Time Evaluation Patient Details Name: GALILEA QUITO MRN: [id number] DOB: [birth year] Today's Date: 12/17/2015   History of Present Illness  Pt is a 70 y.o. female with PMHx of stroke, HTN, DM and recent diagnosis of mediastinal tumor who was admitted for hypertensive urgency and aggressive mediastional tumor complicated by acute respiratory failure with hypoxia, chest and back pain    Clinical Impression  Patient evaluated by Physical Therapy with no further acute PT needs identified. All education has been completed and the patient has no further questions.   Pt very agreeable to mobilize and reports not wearing oxygen at home.  Pt ambulated in hallway on room air with SpO2 93% or above and denied SOB.  Pt left off O2 [place] (RN notified) however left pulse ox in room and O2 tubing within reach of pt.  Pt agreeable to mobilize with family or staff during acute stay and agreeable to no follow-up Physical Therapy or equipment needs at this time. PT is signing off. Thank you for this referral.     Follow Up Recommendations No PT follow up    Equipment Recommendations  None recommended by PT    Recommendations for Other Services       Precautions / Restrictions Precautions Precautions: None      Mobility  Bed Mobility Overal bed mobility: Modified Independent                Transfers Overall transfer level: Modified independent                  Ambulation/Gait Ambulation/Gait assistance: Supervision;Modified independent (Device/Increase time) Ambulation Distance (Feet): 180 Feet Assistive device: None Gait Pattern/deviations: Step-through pattern     General Gait Details: decreased pace however steady gait, pt denies any SOB, ambulated on room air with SPO2 93% or above (mostly 95%)  Stairs            Wheelchair Mobility    Modified Rankin (Stroke Patients Only)       Balance                                              Pertinent Vitals/Pain Pain Assessment: No/denies pain    Home Living Family/patient expects to be discharged to:: Private residence Living Arrangements: Alone Available Help at Discharge: Family;Available PRN/intermittently Type of Home: House       Home Layout: One level Home Equipment: None Additional Comments: daughter takes pt to appointments    Prior Function Level of Independence: Independent               Hand Dominance        Extremity/Trunk Assessment               Lower Extremity Assessment: Overall WFL for tasks assessed         Communication   Communication: No difficulties  Cognition Arousal/Alertness: Awake/alert Behavior During Therapy: WFL for tasks assessed/performed Overall Cognitive Status: Within Functional Limits for tasks assessed                      General Comments      Exercises        Assessment/Plan    PT Assessment Patent does not need any further PT services  PT Diagnosis Difficulty walking   PT Problem List    PT Treatment Interventions  PT Goals (Current goals can be found in the Care Plan section) Acute Rehab PT Goals PT Goal Formulation: All assessment and education complete, DC therapy    Frequency     Barriers to discharge        Co-evaluation               End of Session   Activity Tolerance: Patient tolerated treatment well Patient left: in chair;with call bell/phone within reach;with family/visitor present           Time: 1052-1105 PT Time Calculation (min) (ACUTE ONLY): 13 min   Charges:   PT Evaluation $PT Eval Low Complexity: 1 Procedure     PT G Codes:        Zaiya Annunziato,KATHrine E 12/17/2015, 12:00 PM Carmelia Bake, PT, DPT 12/17/2015 Pager: [phone]

## 2015-12-17 NOTE — Progress Notes (Signed)
Daily Progress Note   Patient Name: Katherine Bullock       Date: 12/17/2015 DOB: [birth year]  Age: 70 y.o. MRN#: [id number] Attending Physician: Venetia Maxon Rama, MD Primary Care Physician: Marco Collie, MD Admit Date: 12/10/2015  Reason for Consultation/Follow-up: Establishing goals of care, Non pain symptom management and Psychosocial/spiritual support  Subjective:  Katherine Bullock is awake and alert. She is sitting up in bed. Daughter Tammy at the bedside.  Patient is in good spirits, she wants to go home.  Symptoms of cough dyspnea and hemoptysis are well controlled this am.    See below See discussions below: Length of Stay: 6  Current Medications: Scheduled Meds:  . amLODipine  5 mg Oral Daily  . aspirin  162 mg Oral Daily  . atorvastatin  40 mg Oral Daily  . citalopram  20 mg Oral Daily  . diltiazem  240 mg Oral Daily  . enoxaparin (LOVENOX) injection  40 mg Subcutaneous Q24H  . feeding supplement (GLUCERNA SHAKE)  237 mL Oral TID BM  . insulin aspart  0-5 Units Subcutaneous QHS  . insulin aspart  0-9 Units Subcutaneous TID WC  . insulin aspart  3 Units Subcutaneous TID WC  . ipratropium-albuterol  3 mL Nebulization QID  . lisinopril  5 mg Oral Daily  . morphine  30 mg Oral Q12H  .  morphine injection  2 mg Intravenous Once  . polyethylene glycol  17 g Oral Daily  . senna  1 tablet Oral BID  . spironolactone  12.5 mg Oral Daily    Continuous Infusions:    PRN Meds: acetaminophen **OR** acetaminophen, albuterol, diltiazem, hydrALAZINE, morphine injection, morphine CONCENTRATE, ondansetron **OR** ondansetron (ZOFRAN) IV  Physical Exam         Elderly appearing lady resting in bed S1-S2  clear anterior respirations Awake alert oriented Muscle wasting, cancer related cachexia  evident No edema  Vital Signs: BP 122/63 (BP Location: Right Arm)   Pulse 75   Temp 98.4 F (36.9 C) (Oral)   Resp 20   Ht '5\' 4"'$  (1.626 m)   Wt 58.3 kg (128 lb 8 oz)   SpO2 100%   BMI 22.06 kg/m  SpO2: SpO2: 100 % O2 Device: O2 Device: Nasal Cannula O2 Flow Rate: O2 Flow Rate (L/min): 3 L/min  Intake/output summary:  Intake/Output Summary (Last 24 hours) at 12/17/15 1109 Last data filed at 12/17/15 0900  Gross per 24 hour  Intake              240 ml  Output             1800 ml  Net            -1560 ml   LBM: Last BM Date: 12/11/15 Baseline Weight: Weight: 56.7 kg (125 lb) Most recent weight: Weight: 58.3 kg (128 lb 8 oz)       Palliative Assessment/Data:    Flowsheet Rows   Flowsheet Row Most Recent Value  Intake Tab  Referral Department  Hospitalist  Unit at Time of Referral  Oncology Unit  Palliative Care Primary Diagnosis  Cancer  Date Notified  12/13/15  Palliative Care Type  Return patient Palliative Care  Reason for referral  Non-pain Symptom, Clarify Goals of Care  Date of Admission  12/10/15  Date first seen by Palliative Care  12/14/15  # of days Palliative referral response time  1 Day(s)  # of days IP prior to Palliative referral  3  Clinical Assessment  Palliative Performance Scale Score  30%  Pain Max last 24 hours  6  Pain Min Last 24 hours  5  Dyspnea Max Last 24 Hours  6  Dyspnea Min Last 24 hours  5  Nausea Min Last 24 Hours  5  Anxiety Max Last 24 Hours  4  Psychosocial & Spiritual Assessment  Palliative Care Outcomes  Patient/Family meeting held?  Yes  Who was at the meeting?  patient, daughter, son.   Palliative Care Outcomes  Clarified goals of care, Improved non-pain symptom therapy  Palliative Care follow-up planned  Yes, Home  Palliative Care Follow-up Reason  Advanced care planning, Clarify goals of care, Non-pain symptom      Patient Active Problem List   Diagnosis Date Noted  . Encounter for palliative care   . Goals of  care, counseling/discussion   . Paroxysmal atrial fibrillation (HCC)   . Dyspnea   . Primary cancer of right upper lobe of lung ([place]) 12/11/2015  . Acute respiratory failure with hypoxia ([place]) 12/10/2015  . Back pain 12/10/2015  . Hypertensive urgency 12/10/2015  . Diabetes mellitus type 2, controlled ([place]) 12/10/2015  . Acute respiratory failure ([place]) 12/10/2015  . COPD, mild ([place]) 12/01/2015  . Alpha-1-antitrypsin deficiency carrier ([place]) 11/06/2015  . Emphysema of lung ([place]) 10/27/2015  . Tobacco use disorder 10/27/2015  . Occlusion and stenosis of carotid artery without mention of cerebral infarction 12/06/2013  . CVA (cerebral vascular accident) ([place]) 12/06/2013  . Chronic periscapular pain 09/19/2011    Palliative Care Assessment & Plan   Patient Profile:    Assessment:  Mediastinal tumor Lung cancer Cough, hemoptysis, shortness of breath-extensive symptom burden constipation  Recommendations/Plan:  Appreciate chaplain follow-up. Patient has designated her daughter [name] to be her healthcare power of attorney agent   MS Contin to 30 mg by mouth every 12 hours scheduled medication. Continue IV morphine when necessary.    Bowel regimen modified.   Patient and family have discussed with both medical oncology and radiation oncology with regards to issues pertaining to prognosis, extent of her cancer etc. On 12-15-15.   Full code for now. Discussed extensively about CODE STATUS. Discussed extensively about medical recommendation for establishing DO NOT RESUSCITATE/DO NOT INTUBATE given the patient's cancer and rapidly declining functional status and increased to symptom burden. Remains full code  for now. Wishes to continue with palliative radiation treatments, wishes to follow up with Dr Earlie Server on d/c.    Code Status:    Code Status Orders        Start     Ordered   12/15/15 1010  Full code  Continuous     12/15/15 1009    Code Status History    Date Active Date  Inactive Code Status Order ID Comments User Context   12/14/2015  1:16 PM 12/14/2015  2:46 PM DNR [id number]  Murlean Iba, MD Inpatient   12/11/2015  4:13 PM 12/11/2015  4:31 PM DNR [id number]  Murlean Iba, MD Inpatient   12/10/2015  2:44 AM 12/10/2015  3:13 PM Full Code [id number]  Rise Patience, MD Inpatient       Prognosis:   < 6 months?  Discharge Planning:  Home with home health for now.   Care plan was discussed with  Patient, and daughter.   Thank you for allowing the Palliative Medicine Team to assist in the care of this patient.   Time In: 9 Time Out: 925 Total Time 25 Prolonged Time Billed  no       Greater than 50%  of this time was spent counseling and coordinating care related to the above assessment and plan.  Loistine Chance, MD [phone]   Please contact Palliative Medicine Team phone at [phone] for questions and concerns.

## 2015-12-17 NOTE — Progress Notes (Signed)
Progress Note    Katherine Bullock  WVP:[id number] DOB: Sep 20, 1945  DOA: 12/10/2015 PCP: Marco Collie, MD    Brief Narrative:   Katherine Bullock is an 70 y.o. female with recent diagnosis of mediastinal tumor Who initially presented to [facility] for evaluation of upper back/chest pain associated with shortness of breath. EKG showed T-wave changes in the lateral leads with patient's blood pressure elevated to more than 546 systolic. Labs including troponin were unremarkable. Patient was placed on nitroglycerin infusion for blood pressure control and treatment of chest pain. CT of the chest was obtained which showed marked progression of her mediastinal tumor with mass effect on the central airways and right pulmonary artery. Patient was transferred to [facility] 12/10/15 for further management and initiation of radiation therapy.  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 Oncology/radiation oncology consulted for initiation of radiation treatment. She has completed 5/10 treatments. She continues to need oxygen and her respiratory status is tenuous given extrinsic compression of the central airways. The palliative care team has been following her. Her MS Contin has been titrated to achieve better pain control. Will try to transition to oral morphine in lieu of IV with D/C soon if pain controlled.    Hypertensive urgency Resolved after being placed on a nitroglycerin drip. Possibly triggered by uncontrolled pain.    New Onset Atrial Fibrillation with RVR with demand ischemia Likely related to distortion of her anatomy secondary to mass effect of mediastinal tumor. TSH WNL. No current recommendations for blood thinners given aggressive radiation therapy and likely initiation  of chemotherapy which will cause bone marrow suppression and be risky. Converted to normal sinus rhythm 12/15/15. Continue oral diltiazem. Elevated troponins felt to be secondary to demand ischemia. Continue with rate control strategy.  Active problems:    Diabetes mellitus type 2, controlled ([place]) Currently being managed with insulin sensitive SSI 3 times a day and 3 units of meal coverage. CBGs 83-270.     COPD Continue duonebs every 4 hours and albuterol neb every 2 hours prn. Given 1 dose of solumedrol 12/13/15.      Severe protein calorie malnutrition Continue nutritional supplements.  Family Communication/Anticipated D/C date and plan/Code Status   DVT prophylaxis: Lovenox ordered. Code Status: Full Code.  Family Communication: No family at bedside. Disposition Plan: Possible D/C home tomorrow.   Medical Consultants:    Radiation Oncology  Palliative Care   Procedures:    Anti-Infectives:   Anti-infectives    None      Subjective:   Katherine Bullock reports some ongoing chest and upper back pain.  No nausea or vomiting.  Does not feel constipated but has not had a BM in several days.  Objective:    Vitals:   12/16/15 2058 12/16/15 2124 12/17/15 0650 12/17/15 0755  BP:  (!) 142/70 122/63   Pulse:  78 75   Resp:   20   Temp:  97.7 F (36.5 C) 98.4 F (36.9 C)   TempSrc:  Oral Oral   SpO2: 98% 100% 99% 100%  Weight:   58.3 kg (128 lb 8 oz)   Height:        Intake/Output Summary (Last 24 hours) at 12/17/15 0837 Last data filed at 12/17/15 0651  Gross per 24 hour  Intake              240 ml  Output             1800 ml  Net            -1560 ml   Filed Weights   12/15/15 0528 12/16/15 0538 12/17/15 0650  Weight: 56.8 kg (125 lb 3.2 oz) 55.7 kg (122 lb 12.8 oz) 58.3 kg (128 lb 8 oz)    Exam: General exam: Appears calm and comfortable. Thin with muscle wasting/cachexia. Respiratory system: Clear to auscultation. Respiratory effort  normal. Cardiovascular system: S1 & S2 heard, RRR. No JVD,  rubs, gallops or clicks. No murmurs. Gastrointestinal system: Abdomen is nondistended, soft and nontender. No organomegaly or masses felt. Normal bowel sounds heard. Central nervous system: Alert and oriented to self/place. No focal neurological deficits. Extremities: No clubbing,  or cyanosis. No edema. Skin: No rashes, lesions or ulcers. Psychiatry: Judgement and insight appear normal. Mood & affect appropriate.   Data Reviewed:   I have personally reviewed following labs and imaging studies:  Labs: Basic Metabolic Panel:  Recent Labs Lab 12/12/15 1346 12/13/15 0328  NA 137 136  K 3.9 3.8  CL 106 104  CO2 24 26  GLUCOSE 85 114*  BUN 12 8  CREATININE 0.63 0.60  CALCIUM 8.8* 8.5*   GFR Estimated Creatinine Clearance: 56.5 mL/min (by C-G formula based on SCr of 0.8 mg/dL). Liver Function Tests:  Recent Labs Lab 12/13/15 0328  AST 20  ALT 13*  ALKPHOS 98  BILITOT 0.7  PROT 6.6  ALBUMIN 3.0*   CBC:  Recent Labs Lab 12/12/15 1346 12/13/15 0328  WBC 14.1* 14.8*  HGB 10.2* 10.4*  HCT 31.2* 32.5*  MCV 92.9 92.1  PLT 490* 467*   Cardiac Enzymes:  Recent Labs Lab 12/13/15 0328 12/13/15 0913 12/13/15 1510 12/13/15 2123 12/14/15 0254  TROPONINI 0.08* 0.07* 0.05* 0.05* 0.04*   CBG:  Recent Labs Lab 12/16/15 0746 12/16/15 1130 12/16/15 1658 12/16/15 2126 12/17/15 0723  GLUCAP 184* 150* 270* 83 177*   Microbiology Recent Results (from the past 240 hour(s))  MRSA PCR Screening     Status: None   Collection Time: 12/10/15  4:00 PM  Result Value Ref Range Status   MRSA by PCR NEGATIVE NEGATIVE Final    Comment:        The GeneXpert MRSA Assay (FDA approved for NASAL specimens only), is one component of a comprehensive MRSA colonization surveillance program. It is not intended to diagnose MRSA infection nor to guide or monitor treatment for MRSA infections.     Radiology: No  results found.  Medications:   . amLODipine  5 mg Oral Daily  . aspirin  162 mg Oral Daily  . atorvastatin  40 mg Oral Daily  . citalopram  20 mg Oral Daily  . diltiazem  240 mg Oral Daily  . docusate sodium  100 mg Oral BID  . enoxaparin (LOVENOX) injection  40 mg Subcutaneous Q24H  . feeding supplement (GLUCERNA SHAKE)  237 mL Oral TID BM  . insulin aspart  0-5 Units Subcutaneous QHS  . insulin aspart  0-9 Units Subcutaneous TID WC  . insulin aspart  3 Units Subcutaneous TID WC  . ipratropium-albuterol  3 mL Nebulization QID  . lisinopril  5 mg Oral Daily  . morphine  30 mg Oral Q12H  .  morphine injection  2 mg Intravenous Once  . spironolactone  12.5 mg Oral Daily  Continuous Infusions:   Time spent: 25 minutes.   LOS: 6 days   [facility] Pager [phone]. If unable to reach me by pager, please call my cell phone at [phone].  *Please refer to [email or web address], password [email or web address] to get updated schedule on who will round on this patient, as hospitalists switch teams weekly. If 7PM-7AM, please contact night-coverage at [email or web address], password [email or web address] for any overnight needs.  12/17/2015, 8:37 AM

## 2015-12-17 NOTE — Care Management Important Message (Signed)
Important Message  Patient Details  Name: WELDA AZZARELLO MRN: [id number] Date of Birth: 1946/03/23   Medicare Important Message Given:  Yes    Camillo Flaming 12/17/2015, 9:56 AMImportant Message  Patient Details  Name: CHELCIE ESTORGA MRN: [id number] Date of Birth: [birth year]   Medicare Important Message Given:  Yes    Camillo Flaming 12/17/2015, 9:55 AM

## 2015-12-18 ENCOUNTER — Other Ambulatory Visit: Payer: Self-pay

## 2015-12-18 ENCOUNTER — Ambulatory Visit
Admit: 2015-12-18 | Discharge: 2015-12-18 | Disposition: A | Discharge status: Home / Self Care | Payer: PPO | Attending: Radiation Oncology | Admitting: Radiation Oncology

## 2015-12-18 LAB — GLUCOSE, CAPILLARY
GLUCOSE-CAPILLARY: 157 mg/dL — AB (ref 65–99)
Glucose-Capillary: 164 mg/dL — ABNORMAL HIGH (ref 65–99)

## 2015-12-18 MED ORDER — MORPHINE SULFATE (CONCENTRATE) 10 MG/0.5ML PO SOLN: 10.0000 mg | ORAL | 0 refills | Status: AC | PRN

## 2015-12-18 MED ORDER — DILTIAZEM HCL ER COATED BEADS 240 MG PO CP24: 240.0000 mg | ORAL_CAPSULE | Freq: Every day | ORAL | 2 refills | Status: AC

## 2015-12-18 MED ORDER — PREMIER PROTEIN SHAKE: 11.0000 [oz_av] | Freq: Two times a day (BID) | ORAL | 2 refills | Status: AC

## 2015-12-18 MED ORDER — POLYETHYLENE GLYCOL 3350 17 G PO PACK: 17.0000 g | PACK | Freq: Every day | ORAL | 2 refills | Status: AC

## 2015-12-18 MED ORDER — MORPHINE SULFATE ER 30 MG PO TBCR: 30.0000 mg | EXTENDED_RELEASE_TABLET | Freq: Two times a day (BID) | ORAL | 0 refills | Status: AC

## 2015-12-18 MED ORDER — SENNA 8.6 MG PO TABS: 1.0000 | ORAL_TABLET | Freq: Two times a day (BID) | ORAL | 0 refills | Status: AC

## 2015-12-18 MED ORDER — BISACODYL 10 MG RE SUPP: 10.0000 mg | Freq: Once | RECTAL | Status: DC

## 2015-12-18 NOTE — Progress Notes (Addendum)
Central tele called to informed pt has converted to A-fib. Will check 12 lead EKG and inform MD. SRP, RN

## 2015-12-18 NOTE — Progress Notes (Signed)
Pt converted to AFib/flutter '@0930'$ .  12-lead confirmed A-Flutter. Pt asymptomatic, MD notified. SRP, RN

## 2015-12-18 NOTE — Progress Notes (Signed)
Pt discharged to home with daughter, instructions given acknowledge understanding. SRP, RN

## 2015-12-18 NOTE — Progress Notes (Signed)
Pt declined HH at present time.  

## 2015-12-18 NOTE — Progress Notes (Signed)
Daily Progress Note   Patient Name: Katherine Bullock       Date: 12/18/2015 DOB: 11-19-1945  Age: 70 y.o. MRN#: [id number] Attending Physician: Venetia Maxon Rama, MD Primary Care Physician: Marco Collie, MD Admit Date: 12/10/2015  Reason for Consultation/Follow-up: Establishing goals of care, Non pain symptom management and Psychosocial/spiritual support  Subjective:  Katherine Bullock is awake and alert. She is resting in bed.  Symptoms of cough dyspnea and hemoptysis are well controlled this am.   Still has not had a bowel movement, patient states she thinks she has IBS, she has irregular BMs at home. She is passing gas. No abdominal pain.   See below See discussions below: Length of Stay: 7  Current Medications: Scheduled Meds:  . amLODipine  5 mg Oral Daily  . aspirin  162 mg Oral Daily  . atorvastatin  40 mg Oral Daily  . bisacodyl  10 mg Rectal Once  . citalopram  20 mg Oral Daily  . diltiazem  240 mg Oral Daily  . enoxaparin (LOVENOX) injection  40 mg Subcutaneous Q24H  . insulin aspart  0-5 Units Subcutaneous QHS  . insulin aspart  0-9 Units Subcutaneous TID WC  . insulin aspart  3 Units Subcutaneous TID WC  . ipratropium-albuterol  3 mL Nebulization TID  . lisinopril  5 mg Oral Daily  . morphine  30 mg Oral Q12H  .  morphine injection  2 mg Intravenous Once  . polyethylene glycol  17 g Oral Daily  . protein supplement shake  11 oz Oral BID BM  . senna  1 tablet Oral BID  . spironolactone  12.5 mg Oral Daily    Continuous Infusions:    PRN Meds: acetaminophen **OR** acetaminophen, albuterol, diltiazem, hydrALAZINE, morphine injection, morphine CONCENTRATE, ondansetron **OR** ondansetron (ZOFRAN) IV  Physical Exam         Elderly appearing lady resting in bed S1-S2  clear  anterior respirations Awake alert oriented Muscle wasting, cancer related cachexia   No edema  Vital Signs: BP (!) 124/58 (BP Location: Right Arm)   Pulse 69   Temp 99 F (37.2 C) (Oral)   Resp 20   Ht '5\' 4"'$  (1.626 m)   Wt 56.9 kg (125 lb 7.1 oz)   SpO2 98%   BMI 21.53 kg/m  SpO2: SpO2: 98 %  O2 Device: O2 Device: Not Delivered O2 Flow Rate: O2 Flow Rate (L/min): 3 L/min  Intake/output summary:   Intake/Output Summary (Last 24 hours) at 12/18/15 0935 Last data filed at 12/18/15 0800  Gross per 24 hour  Intake             2100 ml  Output             2550 ml  Net             -450 ml   LBM: Last BM Date: 12/13/15 Baseline Weight: Weight: 56.7 kg (125 lb) Most recent weight: Weight: 56.9 kg (125 lb 7.1 oz)       Palliative Assessment/Data:    Flowsheet Rows   Flowsheet Row Most Recent Value  Intake Tab  Referral Department  Hospitalist  Unit at Time of Referral  Oncology Unit  Palliative Care Primary Diagnosis  Cancer  Date Notified  12/13/15  Palliative Care Type  Return patient Palliative Care  Reason for referral  Non-pain Symptom, Clarify Goals of Care  Date of Admission  12/10/15  Date first seen by Palliative Care  12/14/15  # of days Palliative referral response time  1 Day(s)  # of days IP prior to Palliative referral  3  Clinical Assessment  Palliative Performance Scale Score  30%  Pain Max last 24 hours  6  Pain Min Last 24 hours  5  Dyspnea Max Last 24 Hours  6  Dyspnea Min Last 24 hours  5  Nausea Min Last 24 Hours  5  Anxiety Max Last 24 Hours  4  Psychosocial & Spiritual Assessment  Palliative Care Outcomes  Patient/Family meeting held?  Yes  Who was at the meeting?  patient, daughter, son.   Palliative Care Outcomes  Clarified goals of care, Improved non-pain symptom therapy  Palliative Care follow-up planned  Yes, Home  Palliative Care Follow-up Reason  Advanced care planning, Clarify goals of care, Non-pain symptom      Patient Active  Problem List   Diagnosis Date Noted  . Encounter for palliative care   . Goals of care, counseling/discussion   . Paroxysmal atrial fibrillation (HCC)   . Dyspnea   . Primary cancer of right upper lobe of lung ([place]) 12/11/2015  . Acute respiratory failure with hypoxia ([place]) 12/10/2015  . Back pain 12/10/2015  . Hypertensive urgency 12/10/2015  . Diabetes mellitus type 2, controlled ([place]) 12/10/2015  . Acute respiratory failure ([place]) 12/10/2015  . COPD, mild ([place]) 12/01/2015  . Alpha-1-antitrypsin deficiency carrier ([place]) 11/06/2015  . Emphysema of lung ([place]) 10/27/2015  . Tobacco use disorder 10/27/2015  . Occlusion and stenosis of carotid artery without mention of cerebral infarction 12/06/2013  . CVA (cerebral vascular accident) ([place]) 12/06/2013  . Chronic periscapular pain 09/19/2011    Palliative Care Assessment & Plan   Patient Profile:    Assessment:  Mediastinal tumor Lung cancer Cough, hemoptysis, shortness of breath-extensive symptom burden constipation  Recommendations/Plan:  Appreciate chaplain follow-up. Patient has designated her daughter [name] to be her healthcare power of attorney agent   MS Contin to 30 mg by mouth every 12 hours scheduled medication.   Bowel regimen modified. Try Dulcolax rectal suppository today.   Patient and family have discussed with both medical oncology and radiation oncology with regards to issues pertaining to prognosis, extent of her cancer etc. On 12-15-15.   Full code for now.    Code Status:    Code Status Orders  Start     Ordered   12/15/15 1010  Full code  Continuous     12/15/15 1009    Code Status History    Date Active Date Inactive Code Status Order ID Comments User Context   12/14/2015  1:16 PM 12/14/2015  2:46 PM DNR [id number]  Murlean Iba, MD Inpatient   12/11/2015  4:13 PM 12/11/2015  4:31 PM DNR [id number]  Murlean Iba, MD Inpatient   12/10/2015  2:44 AM 12/10/2015  3:13 PM Full Code [id number]   Rise Patience, MD Inpatient       Prognosis:   < 6 months?  Discharge Planning:  Home with home health for now.   Care plan was discussed with  Patient,    Thank you for allowing the Palliative Medicine Team to assist in the care of this patient.   Time In: 9 Time Out: 925 Total Time 25 Prolonged Time Billed  no       Greater than 50%  of this time was spent counseling and coordinating care related to the above assessment and plan.  Loistine Chance, MD [phone]   Please contact Palliative Medicine Team phone at [phone] for questions and concerns.  Password [email or web address]

## 2015-12-18 NOTE — Discharge Summary (Signed)
Physician Discharge Summary  Katherine Bullock MBW:[id number] DOB: 1946-01-15 DOA: 12/10/2015  PCP: Katherine Collie, MD  Admit date: 12/10/2015 Discharge date: 12/18/2015  Admitted From: Home Discharge disposition: Home   Recommendations for Outpatient Follow-Up:   1. Patient will follow-up at the cancer center for the remainder of her radiation treatments.   Discharge Diagnosis:   Principal Problem:    Acute respiratory failure with hypoxia (HCC) Active Problems:    COPD, mild (HCC)    Back pain    Hypertensive urgency    Diabetes mellitus type 2, controlled ([place])    Acute respiratory failure ([place])    Primary cancer of right upper lobe of lung ([place])    Dyspnea    Encounter for palliative care    Goals of care, counseling/discussion    Paroxysmal atrial fibrillation ([place])    Discharge Condition: Improved.  Diet recommendation: Low sodium, heart healthy.  Carbohydrate-modified.  Regular.  Wound care: None.   History of Present Illness:   [name] is an 70 y.o. female with recent diagnosis of mediastinal tumor Who initially presented to [facility] for evaluation of upper back/chest pain associated with shortness of breath. EKG showed T-wave changes in the lateral leads with patient's blood pressure elevated to more than 017 systolic. Labs including troponin were unremarkable. Patient was placed on nitroglycerin infusion for blood pressure control and treatment of chest pain. CT of the chest was obtained which showed marked progression of her mediastinal tumor with mass effect on the central airways and right pulmonary artery. Patient was transferred to [facility] 12/10/15 for further management and initiation of radiation therapy.  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 Oncology/radiation oncology consulted for initiation of radiation treatment. She has completed 5/10 treatments. She continues to need oxygen and her respiratory status is tenuous given extrinsic compression of the central airways. The palliative care team has been following her. Her pain medications have been adjusted for improved pain control with MS Contin 30 mg every 12 hours and morphine 10 mg orally every 2 hours as needed for breakthrough pain.  Hypertensive urgency Resolved after being placed on a nitroglycerin drip. Possibly triggered by uncontrolled pain.    New Onset Atrial Fibrillation with RVR with demand ischemia Likely related to distortion of her anatomy secondary to mass effect of mediastinal tumor. TSH WNL. No current recommendations for blood thinners given aggressive radiation therapy and likely initiation of chemotherapy which will cause bone marrow suppression and be risky. Converted to normal sinus rhythm 12/15/15. Continue oral diltiazem. Elevated troponins felt to be secondary to demand ischemia. Continue with rate control strategy.  Active problems:    Diabetes mellitus type 2, controlled ([place]) Currently being managed with insulin sensitive SSI 3 times a day and 3 units of meal coverage. CBGs 157-214.    COPD Continue duonebs every 4 hours and albuterol neb every 2 hours prn. Given 1 dose of solumedrol 12/13/15.     Severe protein calorie malnutrition Continue nutritional supplements.   Medical Consultants:    Radiation Oncology  Palliative Care   Discharge Exam:   Vitals:   12/18/15 0629 12/18/15 1350  BP: (!) 124/58 (!) 106/52  Pulse: 69 70  Resp: 20 18  Temp: 99 F (37.2 C) 98.6 F (37 C)  Vitals:   12/17/15 2117 12/18/15 0629 12/18/15 0924 12/18/15 1350  BP: (!) 115/48 (!) 124/58  (!) 106/52  Pulse: 85 69  70  Resp: '20 20  18  '$ Temp: 98.9 F (37.2 C) 99 F (37.2 C)  98.6 F (37 C)  TempSrc:  Oral Oral  Oral  SpO2: 96% 96% 98% 96%  Weight:  56.9 kg (125 lb 7.1 oz)    Height:        General exam: Appears calm and comfortable. Thin with muscle wasting/cachexia. Respiratory system: Course. Respiratory effort mildly increased. Cardiovascular system: S1 & S2 heard, RRR. No JVD,  rubs, gallops or clicks. No murmurs. Gastrointestinal system: Abdomen is nondistended, soft and nontender. No organomegaly or masses felt. Normal bowel sounds heard. Central nervous system: Alert and oriented. No focal neurological deficits. Extremities: No clubbing,  or cyanosis. No edema. Skin: No rashes, lesions or ulcers. Psychiatry: Judgement and insight appear normal. Mood & affect appropriate.    The results of significant diagnostics from this hospitalization (including imaging, microbiology, ancillary and laboratory) are listed below for reference.     Procedures and Diagnostic Studies:   No results found.   Labs:   Basic Metabolic Panel:  Recent Labs Lab 12/12/15 1346 12/13/15 0328  NA 137 136  K 3.9 3.8  CL 106 104  CO2 24 26  GLUCOSE 85 114*  BUN 12 8  CREATININE 0.63 0.60  CALCIUM 8.8* 8.5*   GFR Estimated Creatinine Clearance: 56.5 mL/min (by C-G formula based on SCr of 0.8 mg/dL). Liver Function Tests:  Recent Labs Lab 12/13/15 0328  AST 20  ALT 13*  ALKPHOS 98  BILITOT 0.7  PROT 6.6  ALBUMIN 3.0*   CBC:  Recent Labs Lab 12/12/15 1346 12/13/15 0328  WBC 14.1* 14.8*  HGB 10.2* 10.4*  HCT 31.2* 32.5*  MCV 92.9 92.1  PLT 490* 467*   Cardiac Enzymes:  Recent Labs Lab 12/13/15 0328 12/13/15 0913 12/13/15 1510 12/13/15 2123 12/14/15 0254  TROPONINI 0.08* 0.07* 0.05* 0.05* 0.04*   CBG:  Recent Labs Lab 12/17/15 1141 12/17/15 1631 12/17/15 2123 12/18/15 0736 12/18/15 1201  GLUCAP 179* 176* 214* 157* 164*   Microbiology Recent Results (from the past 240 hour(s))  MRSA PCR Screening     Status: None   Collection Time: 12/10/15  4:00 PM    Result Value Ref Range Status   MRSA by PCR NEGATIVE NEGATIVE Final    Comment:        The GeneXpert MRSA Assay (FDA approved for NASAL specimens only), is one component of a comprehensive MRSA colonization surveillance program. It is not intended to diagnose MRSA infection nor to guide or monitor treatment for MRSA infections.      Discharge Instructions:   Discharge Instructions    Call MD for:  extreme fatigue    Complete by:  As directed   Call MD for:  persistant nausea and vomiting    Complete by:  As directed   Call MD for:  severe uncontrolled pain    Complete by:  As directed   Call MD for:  temperature >100.4    Complete by:  As directed   Diet Carb Modified    Complete by:  As directed   Increase activity slowly    Complete by:  As directed       Medication List    STOP taking these medications   amLODipine 10 MG tablet Commonly known as:  NORVASC   naproxen sodium 220 MG  tablet Commonly known as:  ANAPROX   oxyCODONE-acetaminophen 5-325 MG tablet Commonly known as:  PERCOCET   spironolactone 50 MG tablet Commonly known as:  ALDACTONE     TAKE these medications   aspirin 81 MG tablet Take 162 mg by mouth daily.   citalopram 40 MG tablet Commonly known as:  CELEXA Take 40 mg by mouth daily.   cyclobenzaprine 10 MG tablet Commonly known as:  FLEXERIL Take 10 mg by mouth 3 (three) times daily as needed for muscle spasms.   diltiazem 240 MG 24 hr capsule Commonly known as:  CARDIZEM CD Take 1 capsule (240 mg total) by mouth daily.   gemfibrozil 600 MG tablet Commonly known as:  LOPID Take 600 mg by mouth 2 (two) times daily before a meal.   glimepiride 1 MG tablet Commonly known as:  AMARYL Take 1 mg by mouth daily with breakfast.   hydrALAZINE 50 MG tablet Commonly known as:  APRESOLINE Take 50 mg by mouth 3 (three) times daily.   ketorolac 10 MG tablet Commonly known as:  TORADOL Take 10 mg by mouth every 4 (four) hours as needed  for moderate pain.   LANTUS SOLOSTAR 100 UNIT/ML Solostar Pen Generic drug:  Insulin Glargine Inject 60 Units into the skin 2 (two) times daily.   metFORMIN 1000 MG tablet Commonly known as:  GLUCOPHAGE Take 1,000 mg by mouth 2 (two) times daily.   morphine 30 MG 12 hr tablet Commonly known as:  MS CONTIN Take 1 tablet (30 mg total) by mouth every 12 (twelve) hours.   morphine CONCENTRATE 10 MG/0.5ML Soln concentrated solution Take 0.5 mLs (10 mg total) by mouth every 2 (two) hours as needed for severe pain.   polyethylene glycol packet Commonly known as:  MIRALAX / GLYCOLAX Take 17 g by mouth daily.   protein supplement shake Liqd Commonly known as:  PREMIER PROTEIN Take 325 mLs (11 oz total) by mouth 2 (two) times daily between meals.   senna 8.6 MG Tabs tablet Commonly known as:  SENOKOT Take 1 tablet (8.6 mg total) by mouth 2 (two) times daily.   SONAFINE Apply 1 application topically 2 (two) times daily.   Tiotropium Bromide Monohydrate 2.5 MCG/ACT Aers Commonly known as:  SPIRIVA RESPIMAT Inhale 2 puffs into the lungs every morning.   ZINC PO Take 1 tablet by mouth daily.      Follow-up Information    Eilleen Kempf., MD .   Specialty:  Oncology Why:  Office will call with an appt. Contact information: [place] [address] [phone]            Time coordinating discharge: 35 minutes.  Signed:  RAMA,CHRISTINA  Pager [phone] [facility] 12/18/2015, 4:50 PM

## 2015-12-18 NOTE — Progress Notes (Signed)
[facility] Therapy Treatment Record Phone [phone]   Radiation Therapy was administered to Katherine Bullock on: 12/18/2015  2:06 PM and was treatment # 5 out of a planned course of 10 treatments.  Radiation Treatment  1). Beam photons with 6-10 energy  2). Brachytherapy None  3). Stereotactic Radiosurgery None  4). Other Radiation None     Taylor Spilde J Rupinder Livingston, RT

## 2015-12-21 ENCOUNTER — Ambulatory Visit
Admission: RE | Admit: 2015-12-21 | Discharge: 2015-12-21 | Disposition: A | Discharge status: Home / Self Care | Payer: PPO | Source: Ambulatory Visit | Attending: Radiation Oncology | Admitting: Radiation Oncology

## 2015-12-21 ENCOUNTER — Encounter: Payer: Self-pay | Admitting: Radiation Oncology

## 2015-12-21 VITALS — BP 110/80 | HR 93 | Temp 98.4°F

## 2015-12-21 DIAGNOSIS — F17210 Nicotine dependence, cigarettes, uncomplicated: Secondary | ICD-10-CM | POA: Diagnosis not present

## 2015-12-21 DIAGNOSIS — E785 Hyperlipidemia, unspecified: Secondary | ICD-10-CM | POA: Diagnosis not present

## 2015-12-21 DIAGNOSIS — C3411 Malignant neoplasm of upper lobe, right bronchus or lung: Secondary | ICD-10-CM

## 2015-12-21 DIAGNOSIS — J449 Chronic obstructive pulmonary disease, unspecified: Secondary | ICD-10-CM | POA: Diagnosis not present

## 2015-12-21 DIAGNOSIS — Z510 Encounter for antineoplastic radiation therapy: Secondary | ICD-10-CM | POA: Diagnosis not present

## 2015-12-21 DIAGNOSIS — Z8673 Personal history of transient ischemic attack (TIA), and cerebral infarction without residual deficits: Secondary | ICD-10-CM | POA: Diagnosis not present

## 2015-12-21 DIAGNOSIS — I10 Essential (primary) hypertension: Secondary | ICD-10-CM | POA: Diagnosis not present

## 2015-12-21 DIAGNOSIS — F419 Anxiety disorder, unspecified: Secondary | ICD-10-CM | POA: Diagnosis not present

## 2015-12-21 DIAGNOSIS — M1990 Unspecified osteoarthritis, unspecified site: Secondary | ICD-10-CM | POA: Diagnosis not present

## 2015-12-21 DIAGNOSIS — E119 Type 2 diabetes mellitus without complications: Secondary | ICD-10-CM | POA: Diagnosis not present

## 2015-12-21 DIAGNOSIS — F329 Major depressive disorder, single episode, unspecified: Secondary | ICD-10-CM | POA: Diagnosis not present

## 2015-12-21 LAB — COMPREHENSIVE METABOLIC PANEL
ALBUMIN: 2.7 g/dL — AB (ref 3.5–5.0)
ALK PHOS: 95 U/L (ref 40–150)
ALT: 19 U/L (ref 0–55)
ANION GAP: 10 meq/L (ref 3–11)
AST: 25 U/L (ref 5–34)
BILIRUBIN TOTAL: 0.46 mg/dL (ref 0.20–1.20)
BUN: 14.7 mg/dL (ref 7.0–26.0)
CALCIUM: 10.2 mg/dL (ref 8.4–10.4)
CHLORIDE: 100 meq/L (ref 98–109)
CO2: 26 mEq/L (ref 22–29)
CREATININE: 1.1 mg/dL (ref 0.6–1.1)
EGFR: 51 mL/min/{1.73_m2} — ABNORMAL LOW (ref >90)
Glucose: 49 mg/dl — ABNORMAL LOW (ref 70–140)
Potassium: 4.4 mEq/L (ref 3.5–5.1)
Sodium: 136 mEq/L (ref 136–145)
Total Protein: 7 g/dL (ref 6.4–8.3)

## 2015-12-21 LAB — CBC WITH DIFFERENTIAL/PLATELET
BASO%: 0.4 % (ref 0.0–2.0)
BASOS ABS: 0 10*3/uL (ref 0.0–0.1)
EOS%: 3.2 % (ref 0.0–7.0)
Eosinophils Absolute: 0.3 10*3/uL (ref 0.0–0.5)
HEMATOCRIT: 31.6 % — AB (ref 34.8–46.6)
HGB: 10.2 g/dL — ABNORMAL LOW (ref 11.6–15.9)
LYMPH%: 9.1 % — ABNORMAL LOW (ref 14.0–49.7)
MCH: 29.3 pg (ref 25.1–34.0)
MCHC: 32.4 g/dL (ref 31.5–36.0)
MCV: 90.3 fL (ref 79.5–101.0)
MONO#: 0.9 10*3/uL (ref 0.1–0.9)
MONO%: 11.4 % (ref 0.0–14.0)
NEUT#: 6 10*3/uL (ref 1.5–6.5)
NEUT%: 75.9 % (ref 38.4–76.8)
PLATELETS: 529 10*3/uL — AB (ref 145–400)
RBC: 3.5 10*6/uL — ABNORMAL LOW (ref 3.70–5.45)
RDW: 14.4 % (ref 11.2–14.5)
WBC: 7.9 10*3/uL (ref 3.9–10.3)
lymph#: 0.7 10*3/uL — ABNORMAL LOW (ref 0.9–3.3)

## 2015-12-21 NOTE — Progress Notes (Signed)
Patient and daughter notified that her labs were Ok per Dr. Sondra Come.  Advised her to stop taking the long acting morphine tablets and to take the short acting morphine as needed to see if that is what is causing her confusion.  Her daughter verbalized agreement.  Also advised that her blood sugar is 49 and gave patient graham crackers to eat.  Her daughter said they will get something to eat on the way home.

## 2015-12-21 NOTE — Progress Notes (Signed)
  Radiation Oncology         [phone] ________________________________  Name: Katherine Bullock MRN: [id number]  Date: 12/21/2015  DOB: 11-Nov-1945  Weekly Radiation Therapy Management    ICD-9-CM ICD-10-CM   1. Primary cancer of right upper lobe of lung (HCC) 162.3 C34.11 CBC with Differential     Comprehensive metabolic panel     Current Dose: 20 Gy     Planned Dose:  28 Gy  Narrative . . . . . . . . The patient presents for routine under treatment assessment.                                   The patient presents today after family members noticed the confusion over the past patient also reports some blurry vision and balance issues. She has been on long-acting morphine and short-acting narcotics.                                 Set-up films were reviewed.                                 The chart was checked. Physical Findings. . .  oral temperature is 98.4 F (36.9 C). Her blood pressure is 110/80 and her pulse is 93. Her oxygen saturation is 94%. .  The lungs are clear. The heart has a regular rhythm and rate. Motor strength is 5 out of 5 in the proximal and distal muscle groups of the upper and lower extremities. The pupils are equal round and reactive to light. The extraocular eye movements are intact. Patient shows obvious confusion and has difficulty following through with physical exam evaluation today. She is unsteady to walk by herself Impression . . . . . . . The patient is tolerating radiation. Plan . . . . . . . . . . . . Because of confusion is unknown at this time. The patient will present to the lab for stat chemistries and CBC. The patient had a brain MRI several days ago showing no evidence metastasis or other issue.   ________________________________   Blair Promise, PhD, MD

## 2015-12-21 NOTE — Progress Notes (Addendum)
Katherine Bullock has completed 8 fractions to her right lung.  She was discharged from the hospital on Friday.  Her daughter noticed that her mom was "drunk and out of it a little bit" last night.  She said she is really out of it today for example, she unbuckled her seat belt on the way here and was trying to get out of the car.  She also is having jerking movements like a startle reflex that started this morning.  Her daughter is wondering if her pain medication is causing these symptoms.  She is currently taking morphine 30 mg tablets bid and morphine liquid concentrate 0.5 ml for breaththrough pain.  Patient reports having new blurry vision and is having balance issues. She took the concentrate twice yesterday.  She reports having a good appetite and is eating more than when she was in the hospital.  She reports having nasal congestion on and off but denies having shortness of breath.  She reports having an occasional cough.  BP 110/80 (BP Location: Right Arm, Patient Position: Sitting)   Pulse 93   Temp 98.4 F (36.9 C) (Oral)   SpO2 94%    Wt Readings from Last 3 Encounters:  12/18/15 125 lb 7.1 oz (56.9 kg)  12/01/15 122 lb 9.6 oz (55.6 kg)  11/23/15 129 lb (58.5 kg)

## 2015-12-21 NOTE — Progress Notes (Signed)
FMLA paperwork [employer]) received 8/14, given to nurse

## 2015-12-22 ENCOUNTER — Encounter (HOSPITAL_COMMUNITY): Payer: Self-pay

## 2015-12-22 ENCOUNTER — Ambulatory Visit
Admission: RE | Admit: 2015-12-22 | Discharge: 2015-12-22 | Disposition: A | Discharge status: Home / Self Care | Payer: PPO | Source: Ambulatory Visit | Attending: Radiation Oncology | Admitting: Radiation Oncology

## 2015-12-22 ENCOUNTER — Encounter: Payer: Self-pay | Admitting: Radiation Oncology

## 2015-12-22 ENCOUNTER — Telehealth: Payer: Self-pay | Admitting: Oncology

## 2015-12-22 VITALS — BP 108/76 | HR 86 | Temp 98.2°F | Ht 64.0 in | Wt 125.0 lb

## 2015-12-22 DIAGNOSIS — C3411 Malignant neoplasm of upper lobe, right bronchus or lung: Secondary | ICD-10-CM

## 2015-12-22 DIAGNOSIS — Z510 Encounter for antineoplastic radiation therapy: Secondary | ICD-10-CM | POA: Diagnosis not present

## 2015-12-22 NOTE — Telephone Encounter (Signed)
Called Nicole Kindred - patient's son regarding his FMLA paperwork.  Requested a return call.

## 2015-12-22 NOTE — Progress Notes (Signed)
  Radiation Oncology         [phone]) [phone] ________________________________  Name: Katherine Bullock MRN: [id number]  Date: 12/22/2015  DOB: November 05, 1945  Weekly Radiation Therapy Management    ICD-9-CM ICD-10-CM   1. Primary cancer of right upper lobe of lung (HCC) 162.3 C34.11      Current Dose: 22 Gy     Planned Dose:  28 Gy  Narrative . . . . . . . . The patient presents for routine under treatment assessment.                                   Katherine Bullock has completed 9 fractions to her right lung.  She reports having pain in her abdomen today which her daughter thinks is from constipation.  She has not taken morphine since yesterday morning.  Her daughter reports that she is still confused today, but not as much.  She has been coughing more today.  She has a poor appetite. She had labs on 12/21/15.                                 Set-up films were reviewed.                                 The chart was checked. Physical Findings. . .  height is '5\' 4"'$  (1.626 m) and weight is 125 lb (56.7 kg). Her oral temperature is 98.2 F (36.8 C). Her blood pressure is 108/76 and her pulse is 86. Her oxygen saturation is 99%. .  No thrush noted in the oropharynx. Mildly decreased breath sounds in the right lung. Heart has regular rate and rhythm. Her gait is still unsteady and needs assistance to walk. Impression . . . . . . . The patient is tolerating radiation. Plan . . . . . . . . . . . .  Continue radiation. If her confusion does not improve, we will repeat a brain MRI. Her last MRI was on 12/09/15. Blood work from yesterday showed no explanation for her confusion.  on the cone beam imaging with her daily treatments the large right upper lung mass is decreasing in size. ________________________________   Blair Promise, PhD, MD  This document serves as a record of services personally performed by Gery Pray, MD. It was created on his behalf by Darcus Austin, a trained medical scribe. The creation of this  record is based on the scribe's personal observations and the provider's statements to them. This document has been checked and approved by the attending provider.

## 2015-12-22 NOTE — Progress Notes (Signed)
Katherine Bullock has completed 9 fractions to her right lung.  She reports having pain in her abdomen today which her daughter thinks is from constipation.  She has not taken morphine since yesterday morning.  Her daughter reports that she is still confused today but not as much.  Patient is breathing deeper today.  She has been coughing more today.  BP 108/76 (BP Location: Left Arm, Patient Position: Sitting)   Pulse 86   Temp 98.2 F (36.8 C) (Oral)   Ht '5\' 4"'$  (1.626 m)   Wt 125 lb (56.7 kg)   SpO2 99%   BMI 21.46 kg/m    Wt Readings from Last 3 Encounters:  12/22/15 125 lb (56.7 kg)  12/18/15 125 lb 7.1 oz (56.9 kg)  12/01/15 122 lb 9.6 oz (55.6 kg)

## 2015-12-23 ENCOUNTER — Ambulatory Visit
Admission: RE | Admit: 2015-12-23 | Discharge: 2015-12-23 | Disposition: A | Discharge status: Home / Self Care | Payer: PPO | Source: Ambulatory Visit | Attending: Radiation Oncology | Admitting: Radiation Oncology

## 2015-12-23 ENCOUNTER — Emergency Department (HOSPITAL_COMMUNITY)
Admission: EM | Admit: 2015-12-23 | Discharge: 2015-12-23 | Disposition: A | Discharge status: Home / Self Care | Payer: PPO | Attending: Emergency Medicine | Admitting: Emergency Medicine

## 2015-12-23 ENCOUNTER — Telehealth: Payer: Self-pay | Admitting: Medical Oncology

## 2015-12-23 ENCOUNTER — Encounter (HOSPITAL_COMMUNITY): Payer: Self-pay | Admitting: Emergency Medicine

## 2015-12-23 DIAGNOSIS — Z7982 Long term (current) use of aspirin: Secondary | ICD-10-CM | POA: Insufficient documentation

## 2015-12-23 DIAGNOSIS — C801 Malignant (primary) neoplasm, unspecified: Secondary | ICD-10-CM

## 2015-12-23 DIAGNOSIS — F17290 Nicotine dependence, other tobacco product, uncomplicated: Secondary | ICD-10-CM | POA: Insufficient documentation

## 2015-12-23 DIAGNOSIS — J449 Chronic obstructive pulmonary disease, unspecified: Secondary | ICD-10-CM | POA: Insufficient documentation

## 2015-12-23 DIAGNOSIS — E860 Dehydration: Secondary | ICD-10-CM | POA: Insufficient documentation

## 2015-12-23 DIAGNOSIS — C3411 Malignant neoplasm of upper lobe, right bronchus or lung: Secondary | ICD-10-CM | POA: Diagnosis not present

## 2015-12-23 DIAGNOSIS — I160 Hypertensive urgency: Secondary | ICD-10-CM | POA: Diagnosis not present

## 2015-12-23 DIAGNOSIS — Z7984 Long term (current) use of oral hypoglycemic drugs: Secondary | ICD-10-CM | POA: Diagnosis not present

## 2015-12-23 DIAGNOSIS — Z79899 Other long term (current) drug therapy: Secondary | ICD-10-CM | POA: Diagnosis not present

## 2015-12-23 DIAGNOSIS — F17210 Nicotine dependence, cigarettes, uncomplicated: Secondary | ICD-10-CM | POA: Diagnosis not present

## 2015-12-23 DIAGNOSIS — E119 Type 2 diabetes mellitus without complications: Secondary | ICD-10-CM | POA: Diagnosis not present

## 2015-12-23 DIAGNOSIS — G9340 Encephalopathy, unspecified: Secondary | ICD-10-CM | POA: Diagnosis not present

## 2015-12-23 DIAGNOSIS — Z794 Long term (current) use of insulin: Secondary | ICD-10-CM | POA: Diagnosis not present

## 2015-12-23 DIAGNOSIS — R410 Disorientation, unspecified: Secondary | ICD-10-CM | POA: Diagnosis present

## 2015-12-23 DIAGNOSIS — Z510 Encounter for antineoplastic radiation therapy: Secondary | ICD-10-CM | POA: Diagnosis not present

## 2015-12-23 LAB — COMPREHENSIVE METABOLIC PANEL
ALBUMIN: 3.3 g/dL — AB (ref 3.5–5.0)
ALT: 19 U/L (ref 14–54)
AST: 29 U/L (ref 15–41)
Alkaline Phosphatase: 90 U/L (ref 38–126)
Anion gap: 12 (ref 5–15)
BILIRUBIN TOTAL: 1.1 mg/dL (ref 0.3–1.2)
BUN: 27 mg/dL — AB (ref 6–20)
CO2: 21 mmol/L — AB (ref 22–32)
Calcium: 9.4 mg/dL (ref 8.9–10.3)
Chloride: 100 mmol/L — ABNORMAL LOW (ref 101–111)
Creatinine, Ser: 1.88 mg/dL — ABNORMAL HIGH (ref 0.44–1.00)
GFR calc Af Amer: 30 mL/min — ABNORMAL LOW (ref >60)
GFR calc non Af Amer: 26 mL/min — ABNORMAL LOW (ref >60)
GLUCOSE: 187 mg/dL — AB (ref 65–99)
POTASSIUM: 5.2 mmol/L — AB (ref 3.5–5.1)
SODIUM: 133 mmol/L — AB (ref 135–145)
TOTAL PROTEIN: 7.5 g/dL (ref 6.5–8.1)

## 2015-12-23 LAB — BLOOD GAS, VENOUS
ACID-BASE DEFICIT: 0.1 mmol/L (ref 0.0–2.0)
BICARBONATE: 23.8 meq/L (ref 20.0–24.0)
O2 Saturation: 56.9 %
PCO2 VEN: 37.9 mmHg — AB (ref 45.0–50.0)
PO2 VEN: 33.8 mmHg (ref 31.0–45.0)
Patient temperature: 98.6
TCO2: 21.7 mmol/L (ref 0–100)
pH, Ven: 7.414 — ABNORMAL HIGH (ref 7.250–7.300)

## 2015-12-23 LAB — CBC
HEMATOCRIT: 32.5 % — AB (ref 36.0–46.0)
HEMOGLOBIN: 10.4 g/dL — AB (ref 12.0–15.0)
MCH: 29.5 pg (ref 26.0–34.0)
MCHC: 32 g/dL (ref 30.0–36.0)
MCV: 92.1 fL (ref 78.0–100.0)
Platelets: 583 10*3/uL — ABNORMAL HIGH (ref 150–400)
RBC: 3.53 MIL/uL — ABNORMAL LOW (ref 3.87–5.11)
RDW: 15.1 % (ref 11.5–15.5)
WBC: 9.9 10*3/uL (ref 4.0–10.5)

## 2015-12-23 LAB — CBG MONITORING, ED: Glucose-Capillary: 198 mg/dL — ABNORMAL HIGH (ref 65–99)

## 2015-12-23 LAB — AMMONIA: Ammonia: 11 umol/L (ref 9–35)

## 2015-12-23 MED ORDER — SODIUM CHLORIDE 0.9 % IV SOLN
1000.0000 mL | INTRAVENOUS | Status: DC
  Administered 2015-12-23: 1000 mL via INTRAVENOUS

## 2015-12-23 MED ORDER — LORAZEPAM 1 MG PO TABS: 0.5000 mg | ORAL_TABLET | Freq: Three times a day (TID) | ORAL | 0 refills | Status: AC | PRN

## 2015-12-23 MED ORDER — SODIUM CHLORIDE 0.9 % IV SOLN: 1000.0000 mL | Freq: Once | INTRAVENOUS | Status: DC

## 2015-12-23 MED ORDER — SODIUM CHLORIDE 0.9 % IV SOLN
1000.0000 mL | Freq: Once | INTRAVENOUS | Status: AC
  Administered 2015-12-23: 1000 mL via INTRAVENOUS

## 2015-12-23 NOTE — ED Provider Notes (Signed)
[facility] DEPT Provider Note   CSN: [id number] Arrival date & time: 12/23/15  1404     History   Chief Complaint Chief Complaint  Patient presents with  . Altered Mental Status  . Lung Cancer    HPI Katherine Bullock is a 70 y.o. female.  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 Patient was recently hospitalized, discharged 5 days ago. Since that time she has been having waxing and waning confusion. They discontinued her morphine on Monday as she was extremely agitated and confused while taking it. Some improvement since stopping but is going through periods of confusion and lethargy. He is denying any pain. Her daughter reports that she eats occasionally small amounts. She is not taking much in orally. A consideration is not having vomiting or diarrhea. She has not had documented fever. She is continuing to make odd picking motions with her hands and reaching in the air. Sometimes she is more lucid than others. She is not taking any specific pain medicines at this time but continues to take her regularly prescribed medications Past Medical History:  Diagnosis Date  . Anxiety   . Arthritis   . Cataract   . Cholelithiases   . COPD (chronic obstructive pulmonary disease) ([place])   . Depression   . Diabetes mellitus   . Hyperlipidemia   . Hypertension   . Stroke [facility])     Patient Active Problem List   Diagnosis Date Noted  . Encounter for palliative care   . Goals of care, counseling/discussion   . Paroxysmal atrial fibrillation (HCC)   . Dyspnea   . Primary cancer of right upper lobe of lung ([place]) 12/11/2015  . Acute respiratory failure with hypoxia ([place]) 12/10/2015  . Back pain 12/10/2015  . Hypertensive urgency 12/10/2015  . Diabetes mellitus type 2, controlled ([place]) 12/10/2015  . Acute respiratory failure ([place]) 12/10/2015  . COPD, mild ([place]) 12/01/2015  . Alpha-1-antitrypsin deficiency carrier ([place]) 11/06/2015  .  Emphysema of lung ([place]) 10/27/2015  . Tobacco use disorder 10/27/2015  . Occlusion and stenosis of carotid artery without mention of cerebral infarction 12/06/2013  . CVA (cerebral vascular accident) ([place]) 12/06/2013  . Chronic periscapular pain 09/19/2011    Past Surgical History:  Procedure Laterality Date  . ABDOMINAL HYSTERECTOMY    . APPENDECTOMY    . BLADDER SURGERY    . CATARACT EXTRACTION Bilateral   . CHOLECYSTECTOMY    . ENDOBRONCHIAL ULTRASOUND Bilateral 11/02/2015   Procedure: ENDOBRONCHIAL ULTRASOUND;  Surgeon: Javier Glazier, MD;  Location: [facility];  Service: Cardiopulmonary;  Laterality: Bilateral;  . MANDIBLE SURGERY    . MEDIASTINOSCOPY N/A 11/23/2015   Procedure: MEDIASTINOSCOPY;  Surgeon: Melrose Nakayama, MD;  Location: [facility] OR;  Service: Thoracic;  Laterality: N/A;  . TUBAL LIGATION      OB History    No data available       Home Medications    Prior to Admission medications   Medication Sig Start Date End Date Taking? Authorizing Provider  aspirin 81 MG tablet Take 81-162 mg by mouth daily.    Yes Historical Provider, MD  citalopram (CELEXA) 40 MG tablet Take 40 mg by mouth daily with breakfast.    Yes Historical Provider, MD  cyclobenzaprine (FLEXERIL) 10 MG tablet Take 10 mg by mouth 3 (three) times daily as needed for muscle spasms.   Yes Historical Provider, MD  diltiazem (CARDIZEM CD) 240 MG 24 hr capsule Take 1 capsule (240  mg total) by mouth daily. Patient taking differently: Take 240 mg by mouth daily with breakfast.  12/18/15  Yes Venetia Maxon Rama, MD  emollient (RADIAGEL) gel Apply 1 application topically as needed for wound care.   Yes Historical Provider, MD  gemfibrozil (LOPID) 600 MG tablet Take 600 mg by mouth 2 (two) times daily before a meal.   Yes Historical Provider, MD  glimepiride (AMARYL) 1 MG tablet Take 1 mg by mouth daily with breakfast.    Yes Historical Provider, MD  GLUCERNA (GLUCERNA) LIQD Take 237 mLs by mouth daily.    Yes Historical Provider, MD  hydrALAZINE (APRESOLINE) 50 MG tablet Take 50 mg by mouth 3 (three) times daily.   Yes Historical Provider, MD  Insulin Glargine (LANTUS SOLOSTAR) 100 UNIT/ML Solostar Pen Inject 60 Units into the skin 2 (two) times daily.   Yes Historical Provider, MD  metFORMIN (GLUCOPHAGE) 1000 MG tablet Take 1,000 mg by mouth 2 (two) times daily.   Yes Historical Provider, MD  Morphine Sulfate (MORPHINE CONCENTRATE) 10 MG/0.5ML SOLN concentrated solution Take 0.5 mLs (10 mg total) by mouth every 2 (two) hours as needed for severe pain. 12/18/15  Yes Venetia Maxon Rama, MD  polyethylene glycol (MIRALAX / GLYCOLAX) packet Take 17 g by mouth daily. Patient taking differently: Take 17 g by mouth daily with breakfast.  12/18/15  Yes Venetia Maxon Rama, MD  senna (SENOKOT) 8.6 MG TABS tablet Take 1 tablet (8.6 mg total) by mouth 2 (two) times daily. 12/18/15  Yes Venetia Maxon Rama, MD  Tiotropium Bromide Monohydrate (SPIRIVA RESPIMAT) 2.5 MCG/ACT AERS Inhale 2 puffs into the lungs every morning. 12/01/15  Yes Javier Glazier, MD  LORazepam (ATIVAN) 1 MG tablet Take 0.5 tablets (0.5 mg total) by mouth 3 (three) times daily as needed for anxiety (Patient may take half to 1 tablet as needed for anxiety or restlessness.). 12/23/15   Charlesetta Shanks, MD  morphine (MS CONTIN) 30 MG 12 hr tablet Take 1 tablet (30 mg total) by mouth every 12 (twelve) hours. Patient not taking: Reported on 12/22/2015 12/18/15   Venetia Maxon Rama, MD  protein supplement shake (PREMIER PROTEIN) LIQD Take 325 mLs (11 oz total) by mouth 2 (two) times daily between meals. Patient not taking: Reported on 12/21/2015 12/18/15   Venetia Maxon Rama, MD    Family History Family History  Problem Relation Age of Onset  . Diabetes Mother   . Heart disease Mother   . Breast cancer Mother   . Hyperlipidemia Mother   . Hypertension Mother   . Heart attack Mother   . Heart disease Father   . Heart attack Father   . Hypertension Son   .  Breast cancer Maternal Aunt   . Lung disease Neg Hx     Social History Social History  Substance Use Topics  . Smoking status: Current Every Day Smoker    Packs/day: 1.00    Years: 50.00    Types: Cigarettes    Start date: 05/09/1965  . Smokeless tobacco: Current User    Types: Snuff     Comment: Peak rate of 1ppd  . Alcohol use No     Allergies   Amitriptyline and Flexeril [cyclobenzaprine hcl]   Review of Systems Review of Systems 10 Systems reviewed and are negative for acute change except as noted in the HPI.   Physical Exam Updated Vital Signs BP (!) 109/49   Pulse 78   Temp 97.5 F (36.4 C) (Oral)   Resp  12   SpO2 96%   Physical Exam  Constitutional:  Patient is thin and deconditioned in appearance. She does not have acute respiratory distress. She does seem mildly confused. Color is sallow. No diaphoresis.  HENT:  Head: Normocephalic and atraumatic.  Nose: Nose normal.  Mucous membranes slightly dry.  Eyes: EOM are normal. Pupils are equal, round, and reactive to light.  Neck: Neck supple.  Cardiovascular: Normal rate, regular rhythm, normal heart sounds and intact distal pulses.   Pulmonary/Chest: Effort normal and breath sounds normal.  Abdominal: Soft. She exhibits distension. There is no tenderness.  Abdomen seems slightly distended. Patient does not endorse any tenderness to palpation.  Musculoskeletal: Normal range of motion. She exhibits no edema or tenderness.  Patient does not have any lower extremity edema. The calves are nontender. No evidence of cellulitis. Skin condition of the feet is good.  Neurological:  Patient is somewhat somnolent. She awakens however to voice and answers questions appropriately. She follows commands for grip strength testing and elevation of lower extremities. At other times she does seem to be responding to external stimuli. Showing periods of confusion. He has some picking-type behaviors and intermittent spasmodic jerks.   Skin: Skin is warm and dry. There is pallor.     ED Treatments / Results  Labs (all labs ordered are listed, but only abnormal results are displayed) Labs Reviewed  COMPREHENSIVE METABOLIC PANEL - Abnormal; Notable for the following:       Result Value   Sodium 133 (*)    Potassium 5.2 (*)    Chloride 100 (*)    CO2 21 (*)    Glucose, Bld 187 (*)    BUN 27 (*)    Creatinine, Ser 1.88 (*)    Albumin 3.3 (*)    GFR calc non Af Amer 26 (*)    GFR calc Af Amer 30 (*)    All other components within normal limits  CBC - Abnormal; Notable for the following:    RBC 3.53 (*)    Hemoglobin 10.4 (*)    HCT 32.5 (*)    Platelets 583 (*)    All other components within normal limits  BLOOD GAS, VENOUS - Abnormal; Notable for the following:    pH, Ven 7.414 (*)    pCO2, Ven 37.9 (*)    All other components within normal limits  CBG MONITORING, ED - Abnormal; Notable for the following:    Glucose-Capillary 198 (*)    All other components within normal limits  AMMONIA  URINALYSIS, ROUTINE W REFLEX MICROSCOPIC (NOT AT [facility])    EKG  EKG Interpretation None       Radiology No results found.  Procedures Procedures (including critical care time)  Medications Ordered in ED Medications  0.9 %  sodium chloride infusion (0 mLs Intravenous Stopped 12/23/15 1619)    Followed by  0.9 %  sodium chloride infusion (1,000 mLs Intravenous New Bag/Given 12/23/15 1619)  0.9 %  sodium chloride infusion (not administered)     Initial Impression / Assessment and Plan / ED Course  I have reviewed the triage vital signs and the nursing notes.  Pertinent labs & imaging results that were available during my care of the patient were reviewed by me and considered in my medical decision making (see chart for details).  Clinical Course     Final Clinical Impressions(s) / ED Diagnoses   Final diagnoses:  Encephalopathy  Cancer ([place])  Dehydration  Patient has been having problems with  waxing and waning confusion and lethargy. Her general parents is consistent with encephalopathy. She is at times somnolent but can awaken to appropriate interactions and verbal communication. Her daughter describes episodes of hallucination and confusion followed by lucidity. At this time, there is no sign of acute infectious etiology. Patient does have known significant mediastinal tumor. He is undergoing radiation therapy. Recent MRI does not show metastatic disease. Patient is not having pain complaints. Labs did show dehydration with mild renal insufficiency that is new for the patient. Her daughter does report poor by mouth intake. Patient  was rehydrated with 2 L normal saline. At this time, she will be discharged home. She does have good the in-home care and close follow-up with her oncologist. She was prescribed Ativan. Her daughter can try this for agitation if needed. They have discontinued all morphine, I do not however believe the patient is having withdrawal and she had only been taking it for approximately 4 days before was discontinued. She has not previously been treated with benzodiazepines. New Prescriptions New Prescriptions   LORAZEPAM (ATIVAN) 1 MG TABLET    Take 0.5 tablets (0.5 mg total) by mouth 3 (three) times daily as needed for anxiety (Patient may take half to 1 tablet as needed for anxiety or restlessness.).     Charlesetta Shanks, MD 12/23/15 [phone]

## 2015-12-23 NOTE — ED Triage Notes (Signed)
Pt was diagnosed with lung cancer about a month and a half ago. Family sts tumor is about the size of a football. Pt is undergoing radiation, last treatment was this morning. Family sts that pt has been on timed release morphine as well as liquid morphine and that ever since they started that last week she has been "drunk and confused."  After speaking with the doctor, pt stopped taking the timed release morphine on Monday. Pt still taking liquid morphine. Pt A&O x 4. Denies pain, other than the usual pain associated with her cancer. Denies chest pain or SOB.

## 2015-12-23 NOTE — Telephone Encounter (Signed)
Asking for biopsy results.Snet to [place].

## 2015-12-24 ENCOUNTER — Encounter: Payer: Self-pay | Admitting: Radiation Oncology

## 2015-12-24 ENCOUNTER — Ambulatory Visit
Admission: RE | Admit: 2015-12-24 | Discharge: 2015-12-24 | Disposition: A | Discharge status: Home / Self Care | Payer: PPO | Source: Ambulatory Visit | Attending: Radiation Oncology | Admitting: Radiation Oncology

## 2015-12-24 ENCOUNTER — Telehealth: Payer: Self-pay | Admitting: *Deleted

## 2015-12-24 VITALS — BP 109/40 | HR 98 | Temp 98.4°F | Resp 20

## 2015-12-24 DIAGNOSIS — Z510 Encounter for antineoplastic radiation therapy: Secondary | ICD-10-CM | POA: Diagnosis not present

## 2015-12-24 DIAGNOSIS — C3411 Malignant neoplasm of upper lobe, right bronchus or lung: Secondary | ICD-10-CM

## 2015-12-24 NOTE — Progress Notes (Signed)
Weekly rad treatments right lung, 11/12 completed, was nauseated this am after drinking coffee, threw up, resolved, poor appetite , ate a few bites grits, this am, no coughing, weak , did orthostatic b/p with holding patient standing, still confused, went to ED yesterday, received IVF"S and d/c home with rx , , Ativan,  BP (!) 111/37 (BP Location: Left Arm, Patient Position: Sitting, Cuff Size: Normal)   Pulse 89   Temp 98.4 F (36.9 C) (Oral)   Resp 20   SpO2 100%   Sitting vitals BP (!) 109/40 (BP Location: Left Arm, Patient Position: Standing, Cuff Size: Normal)   Pulse 98   Temp 98.4 F (36.9 C) (Oral)   Resp 20   SpO2 100%  Standing vitals Patient still confused, started to get up out of w/c said she needed to help that girl by rhe table, no one was there where she was pointing 2:02 PM Wt Readings from Last 3 Encounters:  12/22/15 125 lb (56.7 kg)  12/18/15 125 lb 7.1 oz (56.9 kg)  12/01/15 122 lb 9.6 oz (55.6 kg)

## 2015-12-24 NOTE — Telephone Encounter (Signed)
Patient's daughter Katherine Bullock called, [phone].  Katherine Bullock is "living with her at this time.  Discharged from [facility] 12-18-2015.  Confusion started the next day.  Reaching, catching things in the air, major confusion and disorientation but she knows who and where she is and can answer questions.  We're very worried and concerned.  Took her to the ED yesterday.  She is dehydrated and received IVF's.  We also need to know her biopsy results from November 23, 2015.  We can't do chemotherapy without these results.  Dr. Sondra Come told us not to use the time released morphine so confusion is improved.  Please call."

## 2015-12-24 NOTE — Progress Notes (Signed)
Paperwork ([facility]) received 8/17, given to nurse 8/18

## 2015-12-24 NOTE — Progress Notes (Signed)
  Radiation Oncology         [phone]) [phone] ________________________________  Name: Katherine Bullock MRN: [id number]  Date: 12/24/2015  DOB: 1945/07/15  Weekly Radiation Therapy Management    ICD-9-CM ICD-10-CM   1. Primary cancer of right upper lobe of lung (HCC) 162.3 C34.11      Current Dose: 26 Gy     Planned Dose:  28 Gy  Narrative . . . . . . . . The patient presents for unscheduled under treatment assessment.                                   The patient presented to the ED yesterday for continuing confusion and poor food intake. Her morphine was discontinued on Monday as her daughter explained it made her agitated and confused. Labs showed dehydration with mild renal insufficiency. She was rehydrated with 2L normal saline and discharged home with Ativan. 11/12 treatments completed. She had nausea and emesis this morning. He has a poor apptite. She is still confused.                                 Set-up films were reviewed.                                 The chart was checked. Physical Findings. . .  oral temperature is 98.4 F (36.9 C). Her blood pressure is 109/40 (abnormal) and her pulse is 98. Her respiration is 20 and oxygen saturation is 100%. .  No thrush noted in the oropharynx. Mildly decreased breath sounds in the right lung. Heart has regular rate and rhythm. She presents in a wheelchair. Impression . . . . . . . The patient is tolerating radiation. Patient continues to have a lot of confusion unexplained by blood work. Plan . . . . . . . . . . . .  Continue radiation. I do not believe that the patient would able to stay still during a brain MRI. I recommend a CT scan of the brain.. Creatinine was elevated yesterday on blood work so the scan will have to be without contrast.  ________________________________   Blair Promise, PhD, MD  This document serves as a record of services personally performed by Gery Pray, MD. It was created on his behalf by Darcus Austin, a  trained medical scribe. The creation of this record is based on the scribe's personal observations and the provider's statements to them. This document has been checked and approved by the attending provider.

## 2015-12-25 ENCOUNTER — Ambulatory Visit (HOSPITAL_COMMUNITY)
Admission: RE | Admit: 2015-12-25 | Discharge: 2015-12-25 | Disposition: A | Discharge status: Home / Self Care | Payer: PPO | Source: Ambulatory Visit | Attending: Radiation Oncology | Admitting: Radiation Oncology

## 2015-12-25 ENCOUNTER — Encounter: Payer: Self-pay | Admitting: Radiation Oncology

## 2015-12-25 ENCOUNTER — Other Ambulatory Visit: Payer: Self-pay | Admitting: *Deleted

## 2015-12-25 ENCOUNTER — Other Ambulatory Visit: Payer: Self-pay | Admitting: Radiation Oncology

## 2015-12-25 ENCOUNTER — Telehealth: Payer: Self-pay | Admitting: *Deleted

## 2015-12-25 ENCOUNTER — Encounter (HOSPITAL_COMMUNITY): Payer: Self-pay

## 2015-12-25 ENCOUNTER — Ambulatory Visit
Admission: RE | Admit: 2015-12-25 | Discharge: 2015-12-25 | Disposition: A | Discharge status: Home / Self Care | Payer: PPO | Source: Ambulatory Visit | Attending: Radiation Oncology | Admitting: Radiation Oncology

## 2015-12-25 DIAGNOSIS — C3411 Malignant neoplasm of upper lobe, right bronchus or lung: Secondary | ICD-10-CM

## 2015-12-25 DIAGNOSIS — G319 Degenerative disease of nervous system, unspecified: Secondary | ICD-10-CM | POA: Insufficient documentation

## 2015-12-25 DIAGNOSIS — I6782 Cerebral ischemia: Secondary | ICD-10-CM | POA: Diagnosis not present

## 2015-12-25 DIAGNOSIS — H748X3 Other specified disorders of middle ear and mastoid, bilateral: Secondary | ICD-10-CM | POA: Diagnosis not present

## 2015-12-25 DIAGNOSIS — Z510 Encounter for antineoplastic radiation therapy: Secondary | ICD-10-CM | POA: Diagnosis not present

## 2015-12-25 NOTE — Telephone Encounter (Signed)
CALLED PATIENT TO INFORM OF CT FOR 12-25-15 @ 1 PM @ [facility], SPOKE WITH PATIENT'S DAUGHTER- TAMMY HUGHES AND SHE IS AWARE OF THIS TEST AND WILL HAVE HER MOM THERE

## 2015-12-25 NOTE — Telephone Encounter (Signed)
Returned call to pt daughter, who advised she called Dr. Leonarda Salon office and was told the results were in the chart and pt should look in chart for results.  Pt read off Pathology results, questioned what the next step is for pt. Discussed with Tammy MD is out of the office today.   Tammy states " Mom has an MRI today and she will see Radiation as welll. I will ask about Path results while we are there."   Above concerns routed to MD

## 2015-12-28 ENCOUNTER — Ambulatory Visit: Payer: PPO

## 2015-12-29 ENCOUNTER — Ambulatory Visit: Payer: PPO

## 2015-12-30 ENCOUNTER — Ambulatory Visit: Payer: PPO

## 2015-12-31 ENCOUNTER — Ambulatory Visit: Payer: PPO

## 2015-12-31 ENCOUNTER — Telehealth: Payer: Self-pay | Admitting: *Deleted

## 2015-12-31 ENCOUNTER — Telehealth: Payer: Self-pay | Admitting: Oncology

## 2015-12-31 ENCOUNTER — Encounter: Payer: Self-pay | Admitting: Radiation Oncology

## 2015-12-31 NOTE — Telephone Encounter (Signed)
Returned call to [place] discussed pt biopsy results. No further concerns.

## 2015-12-31 NOTE — Progress Notes (Signed)
Paperwork received from doctor 8/23, faxed to [facility] [phone], confirmation received 8/24, copy given to patient

## 2015-12-31 NOTE — Telephone Encounter (Addendum)
Called Katherine Bullock's daughter, Katherine Bullock, regarding the message that Katherine Bullock will be on hospice.  Katherine Bullock said that Katherine Bullock was not able to get up by herself on Sunday and that her blood sugar had "bottomed out."  They took her to the hospital and she had a UTI and her kidneys were shutting down.  She said that they were told that Katherine Bullock has about 2 days left to live.  She said she has been trying to contact Katherine Bullock office regarding Katherine Bullock's official diagnosis.  Advised her that I will call Katherine Bullock nurse about this.  Katherine Bullock verbalized agreement and understanding.  Left a message for Katherine Bullock nurse.

## 2016-01-01 ENCOUNTER — Ambulatory Visit: Payer: PPO

## 2016-01-04 ENCOUNTER — Ambulatory Visit: Payer: PPO

## 2016-01-05 ENCOUNTER — Ambulatory Visit: Payer: PPO

## 2016-01-05 ENCOUNTER — Telehealth: Payer: Self-pay | Admitting: Oncology

## 2016-01-05 NOTE — Telephone Encounter (Signed)
Left a message with Katherine Bullock's husband that Drema's CT scan of her head was normal.  He said that [name] passed away on August 02, 2022.  Advised him to have Katherine Bullock call if she needs anything.

## 2016-01-06 ENCOUNTER — Ambulatory Visit: Payer: PPO

## 2016-01-07 ENCOUNTER — Ambulatory Visit: Payer: PPO

## 2016-01-08 ENCOUNTER — Ambulatory Visit: Payer: PPO

## 2016-01-08 DEATH — deceased

## 2016-01-28 ENCOUNTER — Ambulatory Visit: Admission: RE | Admit: 2016-01-28 | Payer: PPO | Source: Ambulatory Visit | Admitting: Radiation Oncology

## 2016-02-03 NOTE — Progress Notes (Signed)
  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  Indication for treatment: Palliative, pain, breathing issues  Radiation treatment dates:   12/11/15 - 12/25/15  Site/dose:  1) Right lung: 8 Gy in 2 fractions          2) Right lung boost: 20 Gy in 10 fractions  Beams/energy: 1) 3D // 10X, 15X, 6X Photon       2) Isodose Plan // 10X, 15X Photon  Narrative: The patient had mid-back pain controlled with IV and PO pain medications, SOB while using 3L of oxygen, a cough, hemoptysis, blurry vision, balance issues, abdominal pain, a poor appetite, nausea, emesis, dehydration, and confusion. The patient presented to the ED on 12/23/15 for continuing confusion and poor food intake. CT scan of the head on 12/25/15 showed no acute abnormalities. Her confusion was also unexplained by blood work.  Plan: The patient has completed radiation treatment. The patient will return to radiation oncology clinic for routine followup in one month. I advised them to call or return sooner if they have any questions or concerns related to their recovery or treatment.  -----------------------------------  Blair Promise, PhD, MD  This document serves as a record of services personally performed by Gery Pray, MD. It was created on his behalf by Darcus Austin, a trained medical scribe. The creation of this record is based on the scribe's personal observations and the provider's statements to them. This document has been checked and approved by the attending provider.

## 2016-03-22 ENCOUNTER — Ambulatory Visit: Payer: PPO

## 2016-03-22 ENCOUNTER — Ambulatory Visit: Payer: PPO | Admitting: Pulmonary Disease

## 2017-12-30 IMAGING — PT NM PET TUM IMG INITIAL (PI) SKULL BASE T - THIGH
8 series · 25 of 25 positions shown · non-contrast
Comparison: Chest CT 10/22/2015

CLINICAL DATA: Initial treatment strategy for right lung mass.

EXAM:
NUCLEAR MEDICINE PET SKULL BASE TO THIGH
TECHNIQUE: 6.7 mCi F-18 FDG was injected intravenously. Full-ring PET imaging
was performed from the skull base to thigh after the radiotracer. CT
data was obtained and used for attenuation correction and anatomic
localization.
FASTING BLOOD GLUCOSE:  Value: 180 mg/dl

[Series 3: pet sk_thigh ac · axial · 5.0mm · 4.07mm/px · z∈[-866,-42]mm · 4 of 207 slices shown]
[im 1/207]
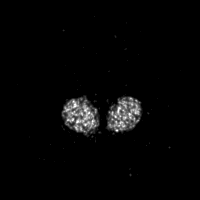
[im 69/207]
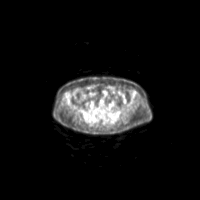
[im 138/207]
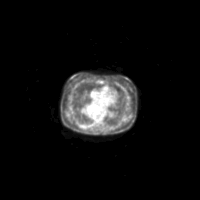
[im 207/207]
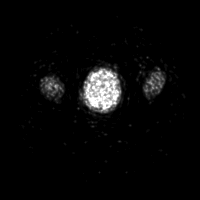

[Series 4: ct sk_thigh 5.0 b31f · axial · 5.0mm · 0.98mm/px · z∈[-866,-42]mm · 5 of 207 slices shown]
[im 1/207]
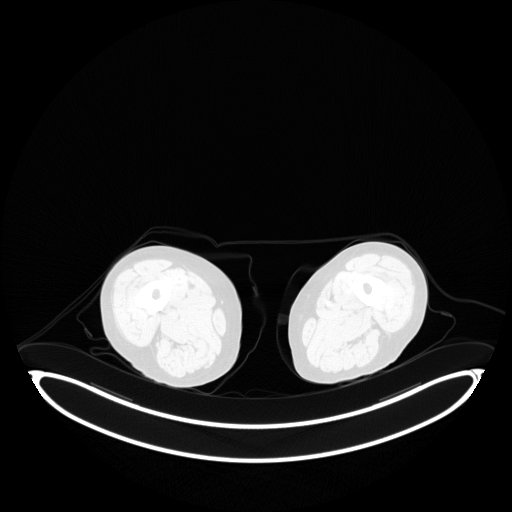
[im 52/207]
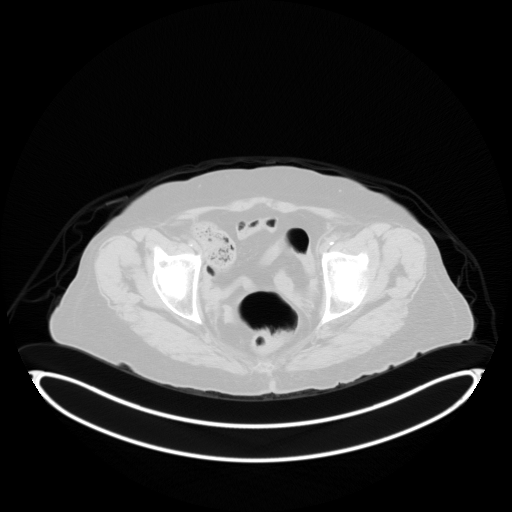
[im 104/207]
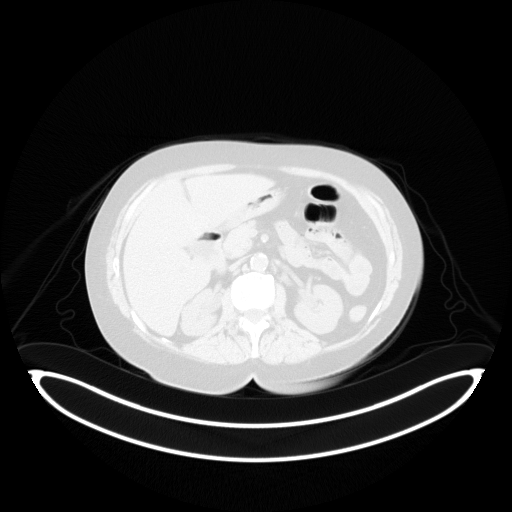
[im 155/207]
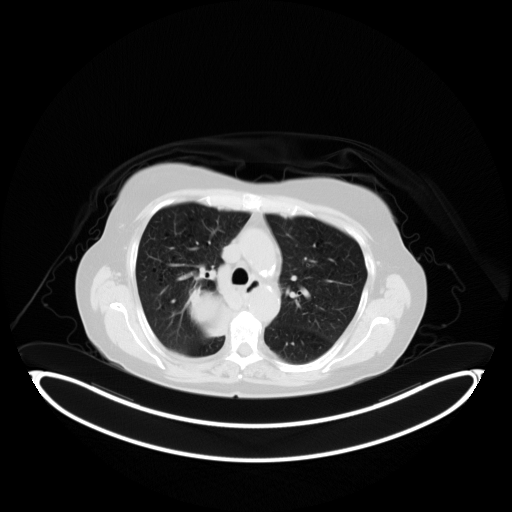
[im 207/207  brain]
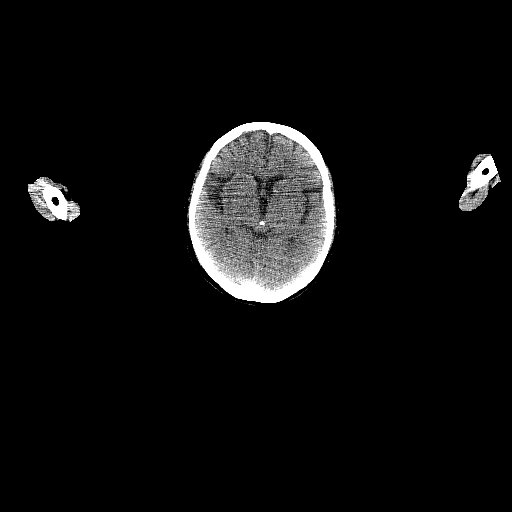

[Series 7: pet sk_thigh nac · axial · 5.0mm · 4.07mm/px · z∈[-866,-42]mm · 5 of 207 slices shown]
[im 1/207]
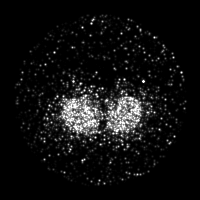
[im 52/207]
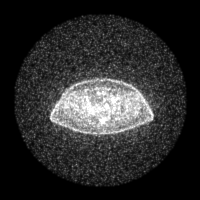
[im 104/207]
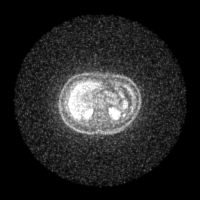
[im 155/207]
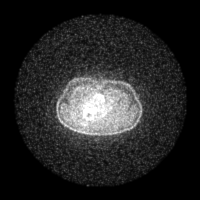
[im 207/207]
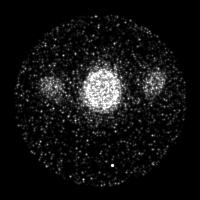

[Series 8: ct sk_thigh 5.0 b70f lung_bone · axial · 5.0mm · 0.68mm/px · z∈[-424,-140]mm · 2 of 72 slices shown]
[im 1/72  bone]
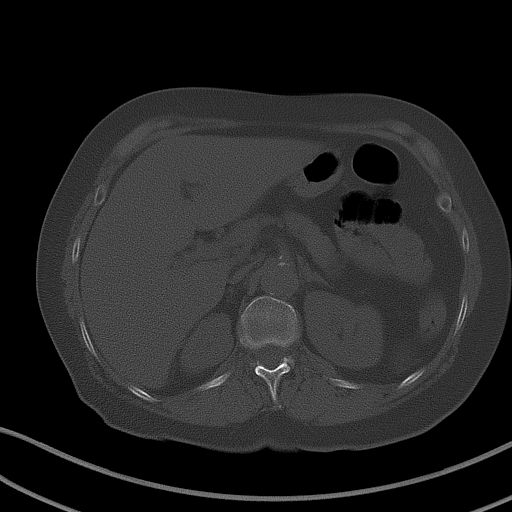
[im 72/72  bone]
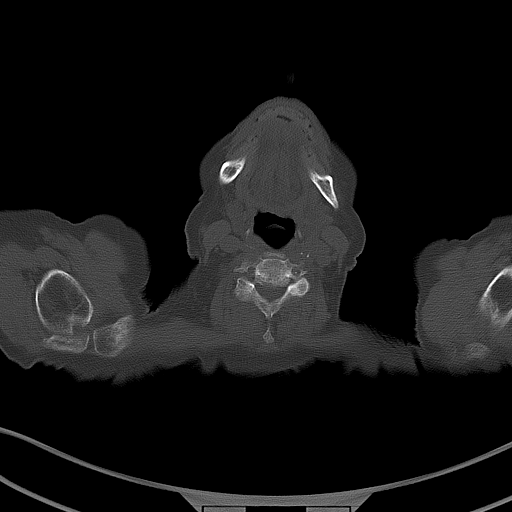

[Series 604: mip collection<mip range> · coronal · 1.71mm/px · 1 of 32 slices shown]
[im 1/32]
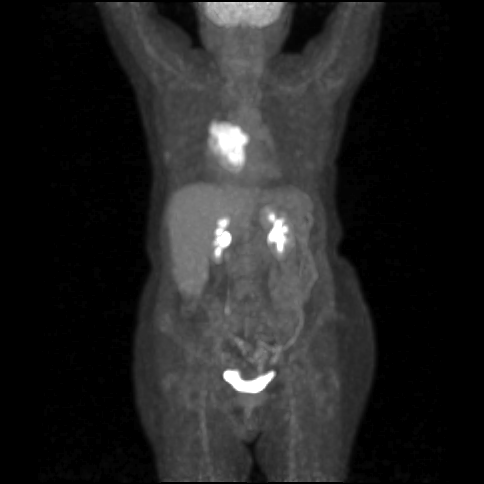

[Series 606: range-ct sk_thigh 5.0 (id)<alpha range(1)> · 2 of 72 slices shown]
[im 1/72]
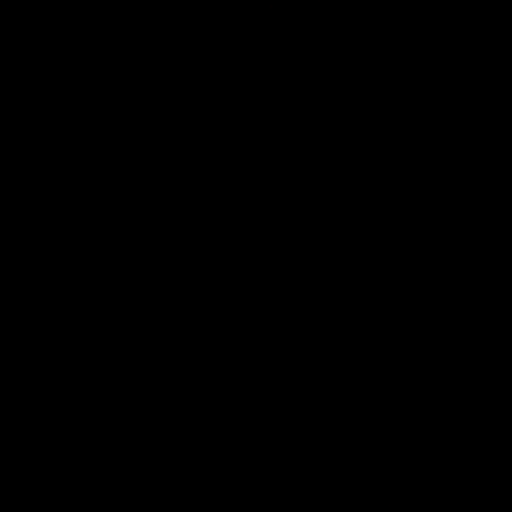
[im 72/72]
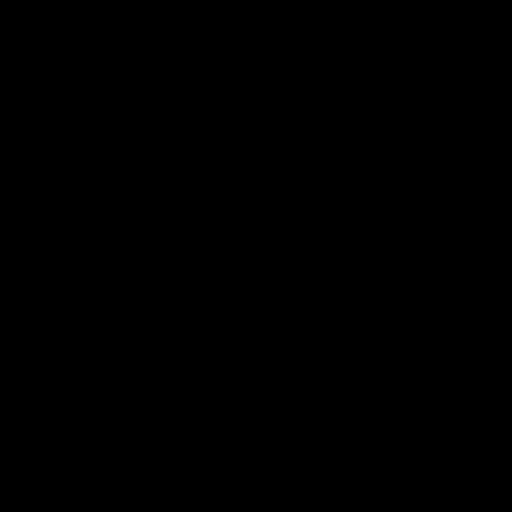

[Series 607: range-ct sk_thigh 5.0 (id)<alpha range> · 5 of 198 slices shown]
[im 1/198]
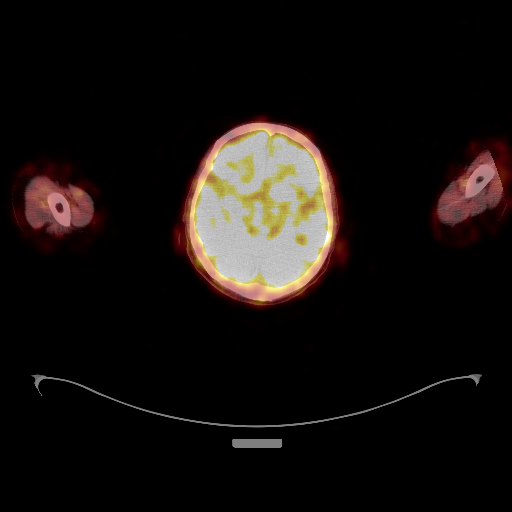
[im 50/198]
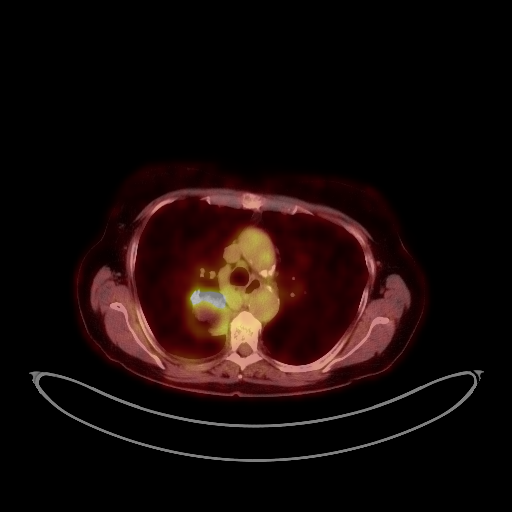
[im 99/198]
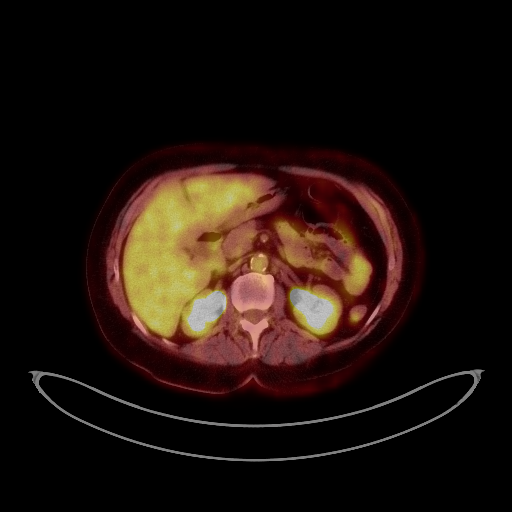
[im 148/198]
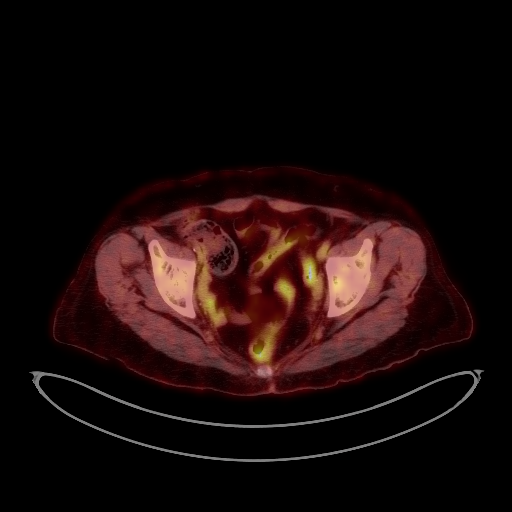
[im 198/198]
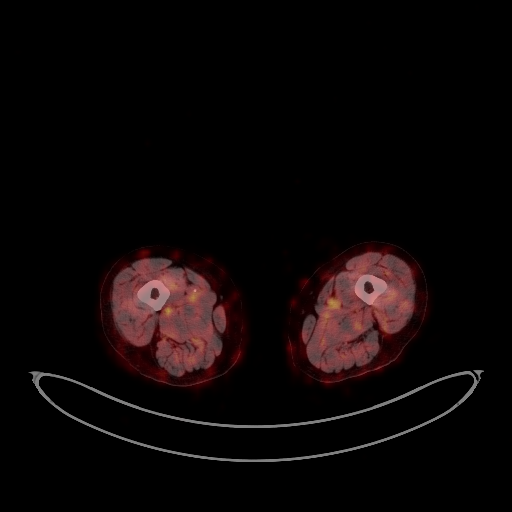

[Series 1033: results mm oncology reading · 1.01mm/px · 1 of 3 slices shown]
[im 1/3]
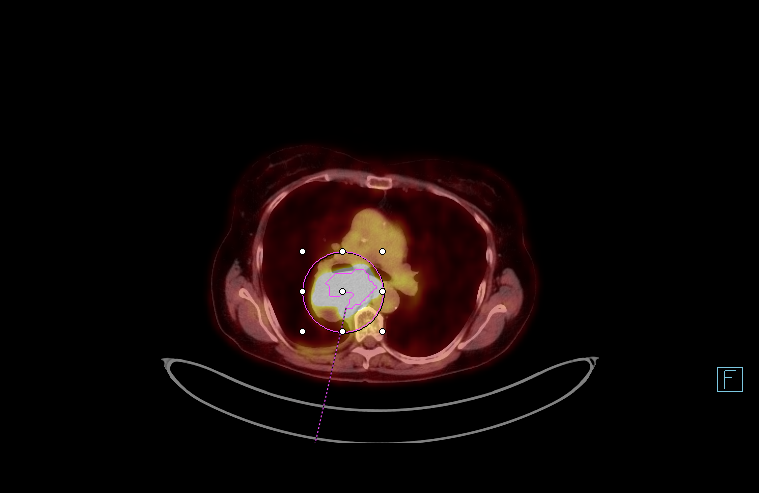

[25 of 25 positions shown; findings below may reference images not displayed]

FINDINGS: NECK

Small right-sided supraclavicular lymph nodes are noted. There is a
7 mm node on image number 33 and a 9 mm node on image number 29.
These are not hypermetabolic. No neck mass.

CHEST

Large right hilar and infrahilar mass is markedly hypermetabolic
with SUV max of 30.7. This is consistent with a central lung
neoplasm invading the right hilum and mediastinum, particularly
these subcarinal space. No contralateral hilar adenopathy. There is
a 7 mm pretracheal node which is mildly hypermetabolic with SUV max
of 3.0.

Area of high attenuation centrally could be a small area of
hemorrhage/hematoma.

As demonstrated on the prior CT scan there are advanced
emphysematous changes but no findings for metastatic pulmonary
nodules.

There is a small right pleural effusion which demonstrates mild
hypermetabolism with SUV max of 3.5. This is worrisome for a
malignant pleural effusion.

ABDOMEN/PELVIS

No abnormal hypermetabolic activity within the liver, pancreas,
adrenal glands, or spleen. No hypermetabolic lymph nodes in the
abdomen or pelvis.

The advanced atherosclerotic calcifications involving the aorta,
branch vessels and iliac arteries. No focal aneurysm.

SKELETON

No focal hypermetabolic activity to suggest skeletal metastasis.
IMPRESSION: 1. 8 cm central right lung mass invading the right hilum and
mediastinum is markedly hypermetabolic and consistent with neoplasm.
2. Borderline enlarged right supraclavicular nodes but no
hypermetabolism.
3. Suspect small malignant right pleural effusion.
4. No findings for metastatic disease involving the lungs,
abdomen/pelvis or bony structures.
